# Patient Record
Sex: Female | Born: 1970 | Hispanic: Yes | Marital: Married | Smoking: Former smoker
Health system: Southern US, Community
[De-identification: ages and names within clinical notes are randomized; demographics above are authoritative.]

## PROBLEM LIST (undated history)

## (undated) DIAGNOSIS — K259 Gastric ulcer, unspecified as acute or chronic, without hemorrhage or perforation: Secondary | ICD-10-CM

## (undated) DIAGNOSIS — R58 Hemorrhage, not elsewhere classified: Secondary | ICD-10-CM

## (undated) DIAGNOSIS — K219 Gastro-esophageal reflux disease without esophagitis: Secondary | ICD-10-CM

## (undated) DIAGNOSIS — Z5189 Encounter for other specified aftercare: Secondary | ICD-10-CM

## (undated) HISTORY — DX: Hemorrhage, not elsewhere classified: R58

## (undated) HISTORY — DX: Gastro-esophageal reflux disease without esophagitis: K21.9

## (undated) HISTORY — DX: Gastric ulcer, unspecified as acute or chronic, without hemorrhage or perforation: K25.9

## (undated) HISTORY — DX: Encounter for other specified aftercare: Z51.89

---

## 2016-07-27 LAB — GLUCOSE, POCT (MANUAL RESULT ENTRY): POC Glucose: 91 mg/dl (ref 70–99)

## 2019-01-18 ENCOUNTER — Other Ambulatory Visit: Payer: Self-pay

## 2019-01-18 DIAGNOSIS — Z20822 Contact with and (suspected) exposure to covid-19: Secondary | ICD-10-CM

## 2019-01-19 LAB — NOVEL CORONAVIRUS, NAA: SARS-CoV-2, NAA: NOT DETECTED

## 2019-04-17 ENCOUNTER — Other Ambulatory Visit: Payer: Self-pay

## 2019-04-17 DIAGNOSIS — Z20822 Contact with and (suspected) exposure to covid-19: Secondary | ICD-10-CM

## 2019-04-19 LAB — NOVEL CORONAVIRUS, NAA: SARS-CoV-2, NAA: DETECTED — AB

## 2019-05-28 ENCOUNTER — Ambulatory Visit: Payer: 59 | Attending: Internal Medicine

## 2019-05-28 DIAGNOSIS — Z20822 Contact with and (suspected) exposure to covid-19: Secondary | ICD-10-CM | POA: Insufficient documentation

## 2019-05-29 LAB — NOVEL CORONAVIRUS, NAA: SARS-CoV-2, NAA: NOT DETECTED

## 2019-06-02 ENCOUNTER — Other Ambulatory Visit: Payer: Self-pay

## 2020-01-03 ENCOUNTER — Ambulatory Visit (INDEPENDENT_AMBULATORY_CARE_PROVIDER_SITE_OTHER): Payer: 59 | Admitting: Family Medicine

## 2020-01-03 ENCOUNTER — Encounter: Payer: Self-pay | Admitting: Family Medicine

## 2020-01-03 ENCOUNTER — Ambulatory Visit (INDEPENDENT_AMBULATORY_CARE_PROVIDER_SITE_OTHER): Payer: 59

## 2020-01-03 ENCOUNTER — Other Ambulatory Visit: Payer: Self-pay

## 2020-01-03 VITALS — BP 117/71 | HR 73 | Temp 98.5°F | Ht 65.0 in | Wt 143.8 lb

## 2020-01-03 DIAGNOSIS — M542 Cervicalgia: Secondary | ICD-10-CM

## 2020-01-03 MED ORDER — GABAPENTIN 600 MG PO TABS: 300.0000 mg | ORAL_TABLET | Freq: Every day | ORAL | 5 refills | Status: DC

## 2020-01-03 NOTE — Progress Notes (Signed)
   8/30/20213:54 PM  Angela Lang [birth year], 49 y.o., female [id number]  Chief Complaint  Patient presents with  . Back Pain    having pain in neck and back for at least 1 yr. Taking ibuprofen for the pain. Thinks the pain contributes to her job, does lots of pushing heavy boxes    HPI:   Patient is a 49 y.o. 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  No flowsheet data found.   No Known Allergies  Prior to Admission medications   Medication Sig Start Date End Date Taking? Authorizing Provider  IBUPROFEN PO Take by mouth.   Yes [provider]    No past medical history on file.  History reviewed. No pertinent surgical history.  Social History   Tobacco Use  . Smoking status: Not on file  Substance Use Topics  . Alcohol use: Not on file    No family history on file.  ROS Per hpi  OBJECTIVE:  Today's Vitals   01/03/20 1533  BP: 117/71  Pulse: 73  Temp: 98.5 F (36.9 C)  SpO2: 98%  Weight: 143 lb 12.8 oz (65.2 kg)  Height: 5\' 5"  (1.651 m)   Body mass index is 23.93 kg/m.   Physical Exam Vitals and nursing note reviewed.  Constitutional:      Appearance: She is well-developed.  HENT:     Head: Normocephalic and atraumatic.  Eyes:     General: No scleral icterus.    Conjunctiva/sclera: Conjunctivae normal.     Pupils: Pupils are equal, round, and reactive to light.  Neck:     Comments: Neg spurling test Pulmonary:     Effort: Pulmonary effort is normal.  Musculoskeletal:     Cervical back: Normal range of motion and neck supple. Pain with movement (extension, left lateral extension),  spinous process tenderness (~ C5-C6) and muscular tenderness present.     Comments: Neg phalen and tinsel, bilateral wrists Positive left elbow flexion test  Skin:    General: Skin is warm and dry.  Neurological:     Mental Status: She is alert and oriented to person, place, and time.     No results found for this or any previous visit (from the past 24 hour(s)).  No results found.   ASSESSMENT and PLAN  1. Neck pain Discussed supportive measures, new meds r/se/b and RTC precautions.  - DG Cervical Spine Complete - pending - Ambulatory referral to Physical Therapy - gabapentin (NEURONTIN) 600 MG tablet; Take 0.5-1 tablets (300-600 mg total) by mouth at bedtime.  No follow-ups on file.    , MD Primary Care at [facility] [address] [place], [place] Ph.  [phone] Fax [phone]

## 2020-01-03 NOTE — Patient Instructions (Signed)
° ° ° °  If you have lab work done today you will be contacted with your lab results within the next 2 weeks.  If you have not heard from us then please contact us. The fastest way to get your results is to register for My Chart. ° ° °IF you received an x-ray today, you will receive an invoice from [facility]. Please contact [facility] at [phone] with questions or concerns regarding your invoice.  ° °IF you received labwork today, you will receive an invoice from [facility]. Please contact [facility] at [phone] with questions or concerns regarding your invoice.  ° °Our billing staff will not be able to assist you with questions regarding bills from these companies. ° °You will be contacted with the lab results as soon as they are available. The fastest way to get your results is to activate your My Chart account. Instructions are located on the last page of this paperwork. If you have not heard from us regarding the results in 2 weeks, please contact this office. °  ° ° ° °

## 2020-01-28 ENCOUNTER — Telehealth: Payer: Self-pay | Admitting: General Practice

## 2020-01-28 NOTE — Telephone Encounter (Signed)
Pt came in and is wanting her referral to physical therapy changed to a location that is closer to pt.   She would like referral to be at location listed below:  [facility] at [facility] [address] [place], [place], [place] [address]  Please advise.

## 2020-03-28 ENCOUNTER — Encounter: Payer: Self-pay | Admitting: Emergency Medicine

## 2020-03-28 ENCOUNTER — Other Ambulatory Visit: Payer: Self-pay

## 2020-03-28 ENCOUNTER — Ambulatory Visit (INDEPENDENT_AMBULATORY_CARE_PROVIDER_SITE_OTHER): Payer: 59 | Admitting: Emergency Medicine

## 2020-03-28 VITALS — BP 101/68 | HR 69 | Temp 98.6°F | Resp 16 | Ht 63.5 in | Wt 144.0 lb

## 2020-03-28 DIAGNOSIS — Z23 Encounter for immunization: Secondary | ICD-10-CM | POA: Diagnosis not present

## 2020-03-28 DIAGNOSIS — G8929 Other chronic pain: Secondary | ICD-10-CM | POA: Diagnosis not present

## 2020-03-28 DIAGNOSIS — M542 Cervicalgia: Secondary | ICD-10-CM

## 2020-03-28 DIAGNOSIS — Z7689 Persons encountering health services in other specified circumstances: Secondary | ICD-10-CM

## 2020-03-28 NOTE — Patient Instructions (Signed)
Mantenimiento de la salud en las mujeres Health Maintenance, Female Adoptar un estilo de vida saludable y recibir atencin preventiva son importantes para promover la salud y el bienestar. Consulte al mdico sobre:  El esquema adecuado para hacerse pruebas y exmenes peridicos.  Cosas que puede hacer por su cuenta para prevenir enfermedades y mantenerse sana. Qu debo saber sobre la dieta, el peso y el ejercicio? Consuma una dieta saludable   Consuma una dieta que incluya muchas verduras, frutas, productos lcteos con bajo contenido de grasa y protenas magras.  No consuma muchos alimentos ricos en grasas slidas, azcares agregados o sodio. Mantenga un peso saludable El ndice de masa muscular (IMC) se utiliza para identificar problemas de peso. Proporciona una estimacin de la grasa corporal basndose en el peso y la altura. Su mdico puede ayudarle a determinar su IMC y a lograr o mantener un peso saludable. Haga ejercicio con regularidad Haga ejercicio con regularidad. Esta es una de las prcticas ms importantes que puede hacer por su salud. La mayora de los adultos deben seguir estas pautas:  Realizar, al menos, 150minutos de actividad fsica por semana. El ejercicio debe aumentar la frecuencia cardaca y hacerlo transpirar (ejercicio de intensidad moderada).  Hacer ejercicios de fortalecimiento por lo menos dos veces por semana. Agregue esto a su plan de ejercicio de intensidad moderada.  Pasar menos tiempo sentados. Incluso la actividad fsica ligera puede ser beneficiosa. Controle sus niveles de colesterol y lpidos en la sangre Comience a realizarse anlisis de lpidos y colesterol en la sangre a los 20aos y luego reptalos cada 5aos. Hgase controlar los niveles de colesterol con mayor frecuencia si:  Sus niveles de lpidos y colesterol son altos.  Es mayor de 40aos.  Presenta un alto riesgo de padecer enfermedades cardacas. Qu debo saber sobre las pruebas de  deteccin del cncer? Segn su historia clnica y sus antecedentes familiares, es posible que deba realizarse pruebas de deteccin del cncer en diferentes edades. Esto puede incluir pruebas de deteccin de lo siguiente:  Cncer de mama.  Cncer de cuello uterino.  Cncer colorrectal.  Cncer de piel.  Cncer de pulmn. Qu debo saber sobre la enfermedad cardaca, la diabetes y la hipertensin arterial? Presin arterial y enfermedad cardaca  La hipertensin arterial causa enfermedades cardacas y aumenta el riesgo de accidente cerebrovascular. Es ms probable que esto se manifieste en las personas que tienen lecturas de presin arterial alta, tienen ascendencia africana o tienen sobrepeso.  Hgase controlar la presin arterial: ? Cada 3 a 5 aos si tiene entre 18 y 39 aos. ? Todos los aos si es mayor de 40aos. Diabetes Realcese exmenes de deteccin de la diabetes con regularidad. Este anlisis revisa el nivel de azcar en la sangre en ayunas. Hgase las pruebas de deteccin:  Cada tresaos despus de los 40aos de edad si tiene un peso normal y un bajo riesgo de padecer diabetes.  Con ms frecuencia y a partir de una edad inferior si tiene sobrepeso o un alto riesgo de padecer diabetes. Qu debo saber sobre la prevencin de infecciones? Hepatitis B Si tiene un riesgo ms alto de contraer hepatitis B, debe someterse a un examen de deteccin de este virus. Hable con el mdico para averiguar si tiene riesgo de contraer la infeccin por hepatitis B. Hepatitis C Se recomienda el anlisis a:  Todos los que nacieron entre 1945 y 1965.  Todas las personas que tengan un riesgo de haber contrado hepatitis C. Enfermedades de transmisin sexual (ETS)  Hgase las   pruebas de deteccin de ITS, incluidas la gonorrea y la clamidia, si: ? Es sexualmente activa y es menor de 24aos. ? Es mayor de 24aos, y el mdico le informa que corre riesgo de tener este tipo de  infecciones. ? La actividad sexual ha cambiado desde que le hicieron la ltima prueba de deteccin y tiene un riesgo mayor de tener clamidia o gonorrea. Pregntele al mdico si usted tiene riesgo.  Pregntele al mdico si usted tiene un alto riesgo de contraer VIH. El mdico tambin puede recomendarle un medicamento recetado para ayudar a evitar la infeccin por el VIH. Si elige tomar medicamentos para prevenir el VIH, primero debe hacerse los anlisis de deteccin del VIH. Luego debe hacerse anlisis cada 3meses mientras est tomando los medicamentos. Embarazo  Si est por dejar de menstruar (fase premenopusica) y usted puede quedar embarazada, busque asesoramiento antes de quedar embarazada.  Tome de 400 a 800microgramos (mcg) de cido flico todos los das si queda embarazada.  Pida mtodos de control de la natalidad (anticonceptivos) si desea evitar un embarazo no deseado. Osteoporosis y menopausia La osteoporosis es una enfermedad en la que los huesos pierden los minerales y la fuerza por el avance de la edad. El resultado pueden ser fracturas en los huesos. Si tiene 65aos o ms, o si est en riesgo de sufrir osteoporosis y fracturas, pregunte a su mdico si debe:  Hacerse pruebas de deteccin de prdida sea.  Tomar un suplemento de calcio o de vitamina D para reducir el riesgo de fracturas.  Recibir terapia de reemplazo hormonal (TRH) para tratar los sntomas de la menopausia. Siga estas instrucciones en su casa: Estilo de vida  No consuma ningn producto que contenga nicotina o tabaco, como cigarrillos, cigarrillos electrnicos y tabaco de mascar. Si necesita ayuda para dejar de fumar, consulte al mdico.  No consuma drogas.  No comparta agujas.  Solicite ayuda a su mdico si necesita apoyo o informacin para abandonar las drogas. Consumo de alcohol  No beba alcohol si: ? Su mdico le indica no hacerlo. ? Est embarazada, puede estar embarazada o est tratando de quedar  embarazada.  Si bebe alcohol: ? Limite la cantidad que consume de 0 a 1 medida por da. ? Limite la ingesta si est amamantando.  Est atento a la cantidad de alcohol que hay en las bebidas que toma. En los [place], una medida equivale a una botella de cerveza de 12oz (355ml), un vaso de vino de 5oz (148ml) o un vaso de una bebida alcohlica de alta graduacin de 1oz (44ml). Instrucciones generales  Realcese los estudios de rutina de la salud, dentales y de la vista.  Mantngase al da con las vacunas.  Infrmele a su mdico si: ? Se siente deprimida con frecuencia. ? Alguna vez ha sido vctima de maltrato o no se siente segura en su casa. Resumen  Adoptar un estilo de vida saludable y recibir atencin preventiva son importantes para promover la salud y el bienestar.  Siga las instrucciones del mdico acerca de una dieta saludable, el ejercicio y la realizacin de pruebas o exmenes para detectar enfermedades.  Siga las instrucciones del mdico con respecto al control del colesterol y la presin arterial. Esta informacin no tiene como fin reemplazar el consejo del mdico. Asegrese de hacerle al mdico cualquier pregunta que tenga. Document Revised: 05/13/2018 Document Reviewed: 05/13/2018 Elsevier Patient Education  2020 Elsevier Inc.  

## 2020-03-28 NOTE — Progress Notes (Signed)
Vida Rigger 49 y.o.   Chief Complaint  Patient presents with  . Transitions Of Care  . Neck Pain    per patient her neck contious to hurt     HISTORY OF PRESENT ILLNESS: This is a 49 y.o. female here to establish care with me.  Used to see Dr. Leretha Pol. Seen last August for neck pain.  Cervical spine x-rays are unremarkable with mild degenerative changes seen. Started on gabapentin taking at nighttime with good results. No new symptoms or medical concerns today. Was referred to physical therapy but unable to fit into her schedule.  HPI   Prior to Admission medications   Medication Sig Start Date End Date Taking? Authorizing Provider  gabapentin (NEURONTIN) 600 MG tablet Take 0.5-1 tablets (300-600 mg total) by mouth at bedtime. 01/03/20   Myles Lipps, MD  IBUPROFEN PO Take by mouth.    [provider]    No Known Allergies  There are no problems to display for this patient.   No past medical history on file.  No past surgical history on file.  Social History   Socioeconomic History  . Marital status: Married    Spouse name: Not on file  . Number of children: Not on file  . Years of education: Not on file  . Highest education level: Not on file  Occupational History  . Not on file  Tobacco Use  . Smoking status: Not on file  Substance and Sexual Activity  . Alcohol use: Not on file  . Drug use: Not on file  . Sexual activity: Not on file  Other Topics Concern  . Not on file  Social History Narrative  . Not on file   Social Determinants of Health   Financial Resource Strain:   . Difficulty of Paying Living Expenses: Not on file  Food Insecurity:   . Worried About Programme researcher, broadcasting/film/video in the Last Year: Not on file  . Ran Out of Food in the Last Year: Not on file  Transportation Needs:   . Lack of Transportation (Medical): Not on file  . Lack of Transportation (Non-Medical): Not on file  Physical Activity:   . Days of Exercise per  Week: Not on file  . Minutes of Exercise per Session: Not on file  Stress:   . Feeling of Stress : Not on file  Social Connections:   . Frequency of Communication with Friends and Family: Not on file  . Frequency of Social Gatherings with Friends and Family: Not on file  . Attends Religious Services: Not on file  . Active Member of Clubs or Organizations: Not on file  . Attends Banker Meetings: Not on file  . Marital Status: Not on file  Intimate Partner Violence:   . Fear of Current or Ex-Partner: Not on file  . Emotionally Abused: Not on file  . Physically Abused: Not on file  . Sexually Abused: Not on file    No family history on file.   Review of Systems  Constitutional: Negative.  Negative for chills and fever.  HENT: Negative.  Negative for congestion and sore throat.   Respiratory: Negative.  Negative for cough and shortness of breath.   Cardiovascular: Negative.  Negative for chest pain and palpitations.  Gastrointestinal: Negative.  Negative for abdominal pain, constipation, diarrhea, nausea and vomiting.  Genitourinary: Negative.   Musculoskeletal: Positive for neck pain.  Skin: Negative.  Negative for rash.  Neurological: Negative.  Negative for dizziness  and headaches.  All other systems reviewed and are negative.    Physical Exam Vitals reviewed.  Constitutional:      Appearance: Normal appearance.  HENT:     Head: Normocephalic.  Eyes:     Extraocular Movements: Extraocular movements intact.     Conjunctiva/sclera: Conjunctivae normal.     Pupils: Pupils are equal, round, and reactive to light.  Cardiovascular:     Rate and Rhythm: Normal rate and regular rhythm.     Pulses: Normal pulses.     Heart sounds: Normal heart sounds.  Pulmonary:     Effort: Pulmonary effort is normal.     Breath sounds: Normal breath sounds.  Musculoskeletal:        General: Normal range of motion.     Cervical back: Normal range of motion and neck supple.  No tenderness.  Lymphadenopathy:     Cervical: No cervical adenopathy.  Skin:    General: Skin is warm and dry.     Capillary Refill: Capillary refill takes less than 2 seconds.  Neurological:     General: No focal deficit present.     Mental Status: She is alert and oriented to person, place, and time.  Psychiatric:        Mood and Affect: Mood normal.        Behavior: Behavior normal.      ASSESSMENT & PLAN: In[place] was seen today for transitions of care and neck pain.  Diagnoses and all orders for this visit:  Encounter to establish care -     Ambulatory referral to Gynecology  Need for influenza vaccination -     Flu Vaccine QUAD 36+ mos IM  Chronic neck pain  Musculoskeletal neck pain    Patient Instructions  Mantenimiento de la salud en las mujeres Health Maintenance, Female Adoptar un estilo de vida saludable y recibir atencin preventiva son importantes para promover la salud y Counsellor. Consulte al mdico sobre:  El esquema adecuado para hacerse pruebas y exmenes peridicos.  Cosas que puede hacer por su cuenta para prevenir enfermedades y [employer]. Qu debo saber sobre la dieta, el peso y el ejercicio? Consuma una dieta saludable   Consuma una dieta que incluya muchas verduras, frutas, productos lcteos con bajo contenido de [place] y Associate Professor.  No consuma muchos alimentos ricos en grasas slidas, azcares agregados o sodio. Mantenga un peso saludable El ndice de masa muscular [facility]) se [place] para identificar problemas de [place]. Proporciona una estimacin de la grasa corporal basndose en el peso y la altura. Su mdico puede ayudarle a Engineer, site IMC y a Personnel officer o Pharmacologist un peso saludable. Haga ejercicio con regularidad Haga ejercicio con regularidad. Esta es una de las prcticas ms importantes que puede hacer por su salud. La mayora de los adultos deben seguir estas pautas:  Education officer, environmental, al menos, de actividad fsica por  semana. El ejercicio debe aumentar la frecuencia cardaca y Media planner transpirar (ejercicio de intensidad moderada).  Hacer ejercicios de fortalecimiento por lo [employer] por semana. Agregue esto a su plan de ejercicio de intensidad moderada.  Pasar menos tiempo sentados. Incluso la actividad fsica ligera puede ser beneficiosa. Controle sus niveles de colesterol y lpidos en la sangre Comience a realizarse anlisis de lpidos y Oncologist en la sangre a los 20aos y luego reptalos cada 5aos. Hgase controlar los niveles de colesterol con mayor frecuencia si:  Sus niveles de lpidos y colesterol son altos.  Es mayor de 40aos.  Presenta  un alto riesgo de Psychiatric nursepadecer enfermedades cardacas. Qu debo saber sobre las pruebas de deteccin del cncer? Segn su historia clnica y sus antecedentes familiares, es posible que deba realizarse pruebas de deteccin del cncer en diferentes edades. Esto puede incluir pruebas de deteccin de lo siguiente:  Cncer de mama.  Cncer de cuello uterino.  Cncer colorrectal.  Cncer de piel.  Cncer de pulmn. Qu debo saber sobre la enfermedad cardaca, la diabetes y la hipertensin arterial? Presin arterial y enfermedad cardaca  La hipertensin arterial causa enfermedades cardacas y Lesothoaumenta el riesgo de accidente cerebrovascular. Es ms probable que esto se manifieste en las personas que tienen lecturas de presin arterial alta, tienen ascendencia africana o tienen sobrepeso.  Hgase controlar la presin arterial: ? Cada 3 a 5 aos si tiene entre 18 y 639 aos. ? Todos los aos si es mayor de [place]40aos. Diabetes Realcese exmenes de deteccin de la diabetes con regularidad. Este anlisis revisa el nivel de azcar en la sangre en [place]. Hgase las pruebas de deteccin:  Cada tresaos despus de los 40aos de edad si tiene un peso normal y un bajo riesgo de padecer diabetes.  Con ms frecuencia y a partir de [place] ed[place] inferior si tiene  sobrepeso o un alto riesgo de padecer diabetes. Qu debo saber sobre la prevencin de infecciones? Hepatitis B Si tiene un riesgo ms alto de contraer hepatitis B, debe someterse a un examen de deteccin de este virus. Hable con el mdico para averiguar si tiene riesgo de contraer la infeccin por hepatitis B. Hepatitis C Se recomienda el anlisis a:  Celanese Corporationodos los que nacieron entre 1945 y 1965.  Todas las personas que tengan un riesgo de haber contrado hepatitis C. Enfermedades de transmisin sexual (ETS)  Hgase las pruebas de Airline pilotdeteccin de ITS, incluidas la gonorrea y la clamidia, si: ? Es sexualmente activa y es menor de [place]24aos. ? Es mayor de 24aos, y Public affairs consultantel mdico le informa que corre riesgo de tener este tipo de infecciones. ? La actividad sexual ha cambiado desde que le hicieron la ltima prueba de deteccin y tiene un riesgo mayor de Warehouse managertener clamidia o Copygonorrea. Pregntele al mdico si usted tiene riesgo.  Pregntele al mdico si usted tiene un alto riesgo de Primary school teachercontraer VIH. El mdico tambin puede recomendarle un medicamento recetado para ayudar a evitar la infeccin por el VIH. Si elige tomar medicamentos para prevenir el VIH, primero debe ONEOKhacerse los anlisis de deteccin del VIH. Luego debe hacerse anlisis cada 3meses mientras est tomando los medicamentos. Embarazo  Si est por dejar de Armed forces training and education officermenstruar (fase premenopusica) y usted puede quedar [place]embarazada, busque asesoramiento antes de [place]iquedar embarazada.  Tome de 400 a 800microgramos (mcg) de cido Ecolabflico todos los das si [place]queda embarazada.  Pida mtodos de control de la natalidad (anticonceptivos) si desea evitar un embarazo no deseado. Osteoporosis y [place]opausia La osteoporosis es una enfermedad en la que los huesos pierden los minerales y la fuerza por el avance de la edad. El resultado pueden ser fracturas en los [place] Land[place]huesos. Si tiene 65aos o ms, o si est en riesgo de sufrir osteoporosis y fracturas, pregunte a su mdico si  debe:  Hacerse pruebas de deteccin de prdida sea.  Tomar un suplemento de calcio o de vitamina D para reducir el riesgo de fracturas.  Recibir terapia de reemplazo hormonal (TRH) para tratar los sntomas de la menopausia. Siga estas instrucciones en su casa: Estilo de vida  No consuma ningn producto que contenga nicotina o tabaco, como cigarrillos,  cigarrillos electrnicos y tabaco de Theatre manager. Si necesita ayuda para dejar de fumar, consulte al mdico.  No consuma drogas.  No comparta agujas.  Solicite ayuda a su mdico si necesita apoyo o informacin para abandonar las drogas. Consumo de alcohol  No beba alcohol si: ? Su mdico le indica no hacerlo. ? Est embarazada, puede estar embarazada o est tratando de quedar embarazada.  Si bebe alcohol: ? Limite la cantidad que consume de 0 a 1 medida por da. ? Limite la ingesta si est amamantando.  Est atento a la cantidad de alcohol que hay en las bebidas que toma. En los [place], una medida equivale a una botella de cerveza de 12oz ( ), un vaso de vino de 5oz ( ) o un vaso de una bebida alcohlica de alta graduacin de 1oz (75ml). Instrucciones generales  Realcese los estudios de rutina de la salud, dentales y de Wellsite geologist.  Mantngase al da con las vacunas.  Infrmele a su mdico si: ? Se siente deprimida con frecuencia. ? Alguna vez ha sido vctima de [place] o no se siente segura en su casa. Resumen  Adoptar un estilo de vida saludable y recibir atencin preventiva son importantes para promover la salud y Counsellor.  Siga las instrucciones del mdico acerca de una dieta saludable, el ejercicio y la realizacin de pruebas o exmenes para Hotel manager.  Siga las instrucciones del mdico con respecto al control del colesterol y la presin arterial. Esta informacin no tiene Theme park manager el consejo del mdico. Asegrese de hacerle al mdico cualquier pregunta que tenga. Document  Revised: 05/13/2018 Document Reviewed: 05/13/2018 Elsevier Patient Education  2020 Elsevier Inc.      Edwina Barth, MD [facility]

## 2020-05-23 ENCOUNTER — Other Ambulatory Visit: Payer: Self-pay

## 2020-07-16 ENCOUNTER — Encounter: Payer: Self-pay | Admitting: *Deleted

## 2020-07-16 ENCOUNTER — Other Ambulatory Visit: Payer: Self-pay

## 2020-07-16 ENCOUNTER — Ambulatory Visit
Admission: EM | Admit: 2020-07-16 | Discharge: 2020-07-16 | Disposition: A | Discharge status: Home / Self Care | Payer: 59

## 2020-07-16 DIAGNOSIS — J069 Acute upper respiratory infection, unspecified: Secondary | ICD-10-CM | POA: Diagnosis not present

## 2020-07-16 DIAGNOSIS — J019 Acute sinusitis, unspecified: Secondary | ICD-10-CM

## 2020-07-16 DIAGNOSIS — H1033 Unspecified acute conjunctivitis, bilateral: Secondary | ICD-10-CM

## 2020-07-16 MED ORDER — AMOXICILLIN-POT CLAVULANATE 875-125 MG PO TABS: 1.0000 | ORAL_TABLET | Freq: Two times a day (BID) | ORAL | 0 refills | Status: DC

## 2020-07-16 MED ORDER — FLUTICASONE PROPIONATE 50 MCG/ACT NA SUSP: 2.0000 | Freq: Every day | NASAL | 0 refills | Status: DC

## 2020-07-16 MED ORDER — POLYMYXIN B-TRIMETHOPRIM 10000-0.1 UNIT/ML-% OP SOLN: 1.0000 [drp] | OPHTHALMIC | 0 refills | Status: AC

## 2020-07-16 NOTE — ED Provider Notes (Signed)
[facility]    CSN: [id number] Arrival date & time: 07/16/20  1348      History   Chief Complaint Chief Complaint  Patient presents with  . Ear Pain  . Nasal Congestion  . Eye Problem    HPI Angela Lang is a 50 y.o. female.   Angela Lang presents with complaints of sinus congestion and pressure, as well as right ear pressure, which started a week ago. Also now with bilateral eye redness and mattering. She feels like she has had drainage from right ear. Started out to right eye and now is to both eyes. No known fevers, headache or body aches. No gi symptoms. Had covid around a year ago. 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 No shortness of breath. She has had sore throat.   Spanish video interpreter used to collect history and physical exam.    ROS per HPI, negative if not otherwise mentioned.      History reviewed. No pertinent past medical history.  There are no problems to display for this patient.   Past Surgical History:  Procedure Laterality Date  . CESAREAN SECTION      OB History   No obstetric history on file.      Home Medications    Prior to Admission medications   Medication Sig Start Date End Date Taking? Authorizing Provider  amoxicillin-clavulanate (AUGMENTIN) 875-125 MG tablet Take 1 tablet by mouth every 12 (twelve) hours. 07/16/20  Yes Mable Dara, Barron Alvine, NP  fluticasone (FLONASE) 50 MCG/ACT nasal spray Place 2 sprays into both nostrils daily. 07/16/20  Yes Linus Mako B, NP  IBUPROFEN PO Take by mouth.   Yes [provider]  Pseudoephedrine HCl (SUDAFED PO) Take by mouth.   Yes [provider]  trimethoprim-polymyxin b (POLYTRIM) ophthalmic solution Place 1 drop into both eyes every 4 (four) hours for 5 days. 07/16/20 07/21/20 Yes Faydra Korman, Barron Alvine, NP  gabapentin (NEURONTIN) 600 MG tablet Take 0.5-1 tablets (300-600 mg total) by mouth at bedtime. 01/03/20   Noni Saupe, MD    Family History Family History  Problem Relation Age of Onset  . Healthy Mother     Social History Social History   Tobacco Use  . Smoking status: Former Smoker    Years: 10.00    Types: Cigarettes  . Smokeless tobacco: Never Used  Vaping Use  . Vaping Use: Never used  Substance Use Topics  . Alcohol use: Never  . Drug use: Never     Allergies   Patient has no known allergies.   Review of Systems Review of Systems   Physical Exam Triage Vital Signs ED Triage Vitals  Enc Vitals Group     BP 07/16/20 1410 116/79     Pulse Rate 07/16/20 1410 66     Resp 07/16/20 1410 14     Temp 07/16/20 1410 98 F (36.7 C)     Temp Source 07/16/20 1410 Temporal     SpO2 07/16/20 1410 97 %     Weight --      Height --      Head Circumference --      Peak Flow --      Pain Score 07/16/20 1411 7     Pain Loc --      Pain Edu? --      Excl. in GC? --    No data found.  Updated Vital Signs BP 116/79  Pulse 66   Temp 98 F (36.7 C) (Temporal)   Resp 14   SpO2 97%   Visual Acuity Right Eye Distance:   Left Eye Distance:   Bilateral Distance:    Right Eye Near:   Left Eye Near:    Bilateral Near:     Physical Exam Constitutional:      General: She is not in acute distress.    Appearance: She is well-developed. She is ill-appearing.  HENT:     Right Ear: Tympanic membrane and ear canal normal. There is no impacted cerumen.     Left Ear: Tympanic membrane and ear canal normal.     Nose: Rhinorrhea present.     Right Sinus: Maxillary sinus tenderness and frontal sinus tenderness present.     Left Sinus: Maxillary sinus tenderness and frontal sinus tenderness present.     Mouth/Throat:     Mouth: Mucous membranes are moist.  Eyes:     General: Lids are normal. Vision grossly intact.     Conjunctiva/sclera:     Right eye: Right conjunctiva is injected.     Left eye: Left conjunctiva is injected.     Comments: R>L injection   Cardiovascular:      Rate and Rhythm: Normal rate and regular rhythm.  Pulmonary:     Effort: Pulmonary effort is normal.  Skin:    General: Skin is warm and dry.  Neurological:     Mental Status: She is alert and oriented to person, place, and time.      UC Treatments / Results  Labs (all labs ordered are listed, but only abnormal results are displayed) Labs Reviewed  NOVEL CORONAVIRUS, NAA    EKG   Radiology No results found.  Procedures Procedures (including critical care time)  Medications Ordered in UC Medications - No data to display  Initial Impression / Assessment and Plan / UC Course  I have reviewed the triage vital signs and the nursing notes.  Pertinent labs & imaging results that were available during my care of the patient were reviewed by me and considered in my medical decision making (see chart for details).     Worsening of sinus symptoms and ear pressure for a week now, as well as conjunctivitis, on exam. Augmentin and polytrim provided. pcr covid testing for return to work. Return precautions provided. Patient and S/O verbalized understanding and agreeable to plan.   Final Clinical Impressions(s) / UC Diagnoses   Final diagnoses:  Acute upper respiratory infection  Acute sinusitis, recurrence not specified, unspecified location  Acute conjunctivitis of both eyes, unspecified acute conjunctivitis type     Discharge Instructions     Push fluids to ensure adequate hydration and keep secretions thin.  Tylenol and/or ibuprofen as needed for pain or fevers.  Complete course of antibiotics for you sinus infection, as well as the eye drops for your eyes. Avoid rubbing your eyes as able.  Daily flonase nasal spray to also help with your ears and sinus symptoms.  If symptoms worsen or do not improve in the next week to return to be seen or to follow up with your primary care provider     ED Prescriptions    Medication Sig Dispense Auth. Provider    trimethoprim-polymyxin b (POLYTRIM) ophthalmic solution Place 1 drop into both eyes every 4 (four) hours for 5 days. 10 mL Linus Mako B, NP   amoxicillin-clavulanate (AUGMENTIN) 875-125 MG tablet Take 1 tablet by mouth every 12 (twelve) hours. 14 tablet  Georgetta Haber, NP   fluticasone (FLONASE) 50 MCG/ACT nasal spray Place 2 sprays into both nostrils daily. 11.1 mL Linus Mako B, NP     PDMP not reviewed this encounter.   Georgetta Haber, NP 07/16/20 1511

## 2020-07-16 NOTE — ED Triage Notes (Addendum)
C/O bilat earache with dizziness and nasal congestion onset 1 wks ago; now having sinus pressure, sore throat, body aches, cough, bilat red & irritated eyes.  Denies fevers. Reports negative home Covid tests 3/8 & 3/9.

## 2020-07-16 NOTE — Discharge Instructions (Signed)
Push fluids to ensure adequate hydration and keep secretions thin.  Tylenol and/or ibuprofen as needed for pain or fevers.  Complete course of antibiotics for you sinus infection, as well as the eye drops for your eyes. Avoid rubbing your eyes as able.  Daily flonase nasal spray to also help with your ears and sinus symptoms.  If symptoms worsen or do not improve in the next week to return to be seen or to follow up with your primary care provider

## 2020-07-17 LAB — SARS-COV-2, NAA 2 DAY TAT

## 2020-07-17 LAB — NOVEL CORONAVIRUS, NAA: SARS-CoV-2, NAA: NOT DETECTED

## 2020-07-24 ENCOUNTER — Ambulatory Visit: Payer: 59 | Admitting: Emergency Medicine

## 2020-07-26 ENCOUNTER — Ambulatory Visit (INDEPENDENT_AMBULATORY_CARE_PROVIDER_SITE_OTHER): Payer: 59 | Admitting: Emergency Medicine

## 2020-07-26 ENCOUNTER — Encounter: Payer: Self-pay | Admitting: Emergency Medicine

## 2020-07-26 ENCOUNTER — Other Ambulatory Visit: Payer: Self-pay

## 2020-07-26 VITALS — BP 99/65 | HR 70 | Temp 98.0°F | Resp 16 | Ht 62.99 in | Wt 144.0 lb

## 2020-07-26 DIAGNOSIS — H60391 Other infective otitis externa, right ear: Secondary | ICD-10-CM | POA: Diagnosis not present

## 2020-07-26 DIAGNOSIS — H9201 Otalgia, right ear: Secondary | ICD-10-CM | POA: Diagnosis not present

## 2020-07-26 MED ORDER — CIPROFLOXACIN-DEXAMETHASONE 0.3-0.1 % OT SUSP: 4.0000 [drp] | Freq: Two times a day (BID) | OTIC | 1 refills | Status: DC

## 2020-07-26 NOTE — Progress Notes (Signed)
Angela Lang 50 y.o.   Chief Complaint  Patient presents with  . Ear Pain    HISTORY OF PRESENT ILLNESS: This is a 50 y.o. female complaining of pain to right ear for several days. Just finished a course of Augmentin prescribed for respiratory infection at urgent care center 10 days ago. No other complaints or medical concerns today.  HPI   Prior to Admission medications   Medication Sig Start Date End Date Taking? Authorizing Provider  amoxicillin-clavulanate (AUGMENTIN) 875-125 MG tablet Take 1 tablet by mouth every 12 (twelve) hours. 07/16/20   Georgetta Haber, NP  fluticasone (FLONASE) 50 MCG/ACT nasal spray Place 2 sprays into both nostrils daily. 07/16/20   Georgetta Haber, NP  gabapentin (NEURONTIN) 600 MG tablet Take 0.5-1 tablets (300-600 mg total) by mouth at bedtime. 01/03/20   Noni Saupe, MD  IBUPROFEN PO Take by mouth.    [provider]  Pseudoephedrine HCl (SUDAFED PO) Take by mouth.    [provider]    No Known Allergies  There are no problems to display for this patient.   No past medical history on file.  Past Surgical History:  Procedure Laterality Date  . CESAREAN SECTION      Social History   Socioeconomic History  . Marital status: Married    Spouse name: Not on file  . Number of children: Not on file  . Years of education: Not on file  . Highest education level: Not on file  Occupational History  . Not on file  Tobacco Use  . Smoking status: Former Smoker    Years: 10.00    Types: Cigarettes  . Smokeless tobacco: Never Used  Vaping Use  . Vaping Use: Never used  Substance and Sexual Activity  . Alcohol use: Never  . Drug use: Never  . Sexual activity: Not on file  Other Topics Concern  . Not on file  Social History Narrative  . Not on file   Social Determinants of Health   Financial Resource Strain: Not on file  Food Insecurity: Not on file  Transportation Needs: Not on file  Physical  Activity: Not on file  Stress: Not on file  Social Connections: Not on file  Intimate Partner Violence: Not on file    Family History  Problem Relation Age of Onset  . Healthy Mother      Review of Systems  Constitutional: Negative.  Negative for chills and fever.  HENT: Positive for ear pain. Negative for congestion and sore throat.   Respiratory: Negative.  Negative for cough and shortness of breath.   Cardiovascular: Negative.  Negative for chest pain and palpitations.  Gastrointestinal: Negative for abdominal pain, diarrhea, nausea and vomiting.  Genitourinary: Negative.  Negative for dysuria and hematuria.  Skin: Negative.  Negative for rash.  Neurological: Negative.  Negative for dizziness and headaches.  All other systems reviewed and are negative.    Today's Vitals   07/26/20 1337  BP: 99/65  Pulse: 70  Resp: 16  Temp: 98 F (36.7 C)  TempSrc: Temporal  SpO2: 97%  Weight: 144 lb (65.3 kg)  Height: 5' 2.99" (1.6 m)   Body mass index is 25.51 kg/m. Wt Readings from Last 3 Encounters:  07/26/20 144 lb (65.3 kg)  03/28/20 144 lb (65.3 kg)  01/03/20 143 lb 12.8 oz (65.2 kg)     Physical Exam Vitals reviewed.  Constitutional:      Appearance: Normal appearance.  HENT:  Head: Normocephalic.     Right Ear: Tympanic membrane normal.     Left Ear: Tympanic membrane, ear canal and external ear normal.     Ears:     Comments: Hyperemic and slightly tender external canal    Mouth/Throat:     Mouth: Mucous membranes are moist.     Pharynx: Oropharynx is clear. No oropharyngeal exudate.  Eyes:     Extraocular Movements: Extraocular movements intact.     Conjunctiva/sclera: Conjunctivae normal.     Pupils: Pupils are equal, round, and reactive to light.  Cardiovascular:     Rate and Rhythm: Normal rate.  Pulmonary:     Effort: Pulmonary effort is normal.  Musculoskeletal:        General: Normal range of motion.     Cervical back: Normal range of motion.  No tenderness.  Lymphadenopathy:     Cervical: No cervical adenopathy.  Skin:    General: Skin is warm and dry.  Neurological:     General: No focal deficit present.     Mental Status: She is alert and oriented to person, place, and time.  Psychiatric:        Mood and Affect: Mood normal.        Behavior: Behavior normal.      ASSESSMENT & PLAN: Jodeen was seen today for ear pain.  Diagnoses and all orders for this visit:  Infective otitis externa of right ear -     ciprofloxacin-dexamethasone (CIPRODEX) OTIC suspension; Place 4 drops into the right ear 2 (two) times daily.  Acute otalgia, right    Patient Instructions       If you have lab work done today you will be contacted with your lab results within the next 2 weeks.  If you have not heard from [place] then please contact us. The fastest way to get your results is to register for My Chart.   IF you received an x-ray today, you will receive an invoice from [facility]. Please contact [facility] at [phone] with questions or concerns regarding your invoice.   IF you received labwork today, you will receive an invoice from [facility]. Please contact [facility] at [phone] with questions or concerns regarding your invoice.   Our billing staff will not be able to assist you with questions regarding bills from these companies.  You will be contacted with the lab results as soon as they are available. The fastest way to get your results is to activate your My Chart account. Instructions are located on the last page of this paperwork. If you have not heard from [place] regarding the results in 2 weeks, please contact this office.     Dolor de [employer] adultos [place], Adult Un dolor de odo puede deberse a muchas causas, que incluyen lo siguiente:  Una infeccin.  Acumulacin de cerumen.  Presin en el odo.  Algo en el odo que no debera estar ah (cuerpo extrao).  Dolor de  Advertising copywriter.  Problemas dentales.  Problemas en la mandbula. El tratamiento del dolor de odo depender de la causa. Si la causa no est clara o no se Counselling psychologist, deber observar los sntomas hasta que el dolor de odo desaparezca o hasta que se descubra la causa. Siga estas instrucciones en su casa: Medicamentos  Tome o aplquese los medicamentos de venta libre y los recetados solamente como se lo haya indicado el mdico.  Si le recetaron un antibitico, selo como se lo haya indicado el mdico. No  deje de usar el antibitico aunque comience a sentirse mejor.  No se ponga nada en el odo que no sean los medicamentos que Glass blower/designer. Control del dolor Si se lo indican, aplique calor en la zona afectada con la frecuencia que le haya indicado el mdico. Use la fuente de calor que el mdico le recomiende, como una compresa de calor hmedo o una almohadilla trmica.  Coloque una toalla entre la piel y la fuente de Airline pilot.  Aplique calor durante 20 a .  Retire la fuente de calor si la piel se pone de color rojo brillante. Esto es especialmente importante si no puede sentir dolor, calor o fro. Puede correr un riesgo mayor de sufrir quemaduras. Si se lo indican, aplique hielo en la zona afectada con la frecuencia que le haya indicado el mdico. Para hacer esto:  Ponga el hielo en una bolsa plstica.  Coloque una toalla entre la piel y Copy.  Coloque el hielo durante , 2 a 3veces por Futures trader.      Instrucciones generales  Est atenta a cualquier cambio en los sntomas.  Intente descansar en posicin erguida, en lugar de recostarse. Esto puede ayudar a reducir la presin en el odo y Engineer, materials.  Mastique goma de mascar si esto ayuda a Engineer, materials de odos.  Trate cualquier [employer] se lo haya indicado el mdico.  Beba suficiente lquido como para Pharmacologist la orina de color amarillo plido.  Es su responsabilidad [employer]  de cualquier prueba que se haya realizado. Consulte al mdico o pregunte en el departamento donde se realizan las pruebas cundo estarn [employer].  Concurra a todas las visitas de [address] se lo haya indicado el mdico. Esto es importante. Comunquese con un mdico si:  El dolor no mejora en el trmino de 2das.  El dolor de odo [place].  Aparecen nuevos sntomas.  Tiene fiebre. Busque ayuda de inmediato si:  Tiene un dolor de cabeza intenso.  Tiene rigidez en el cuello.  Tiene dificultad para tragar.  Tiene enrojecimiento o hinchazn detrs de Museum/gallery conservator.  Le sale sangre o lquido del odo.  Pierde la audicin.  Se siente mareado. Resumen  El dolor de odo puede deberse a muchas causas.  El tratamiento del dolor de odo depender de la causa. Siga las recomendaciones del mdico para tratar su dolor de odo.  Si la causa no est clara o no se Counselling psychologist, deber observar los sntomas hasta que el dolor de odo desaparezca o hasta que se descubra la causa.  Concurra a todas las visitas de [address] se lo haya indicado el mdico. Esto es importante. Esta informacin no tiene Theme park manager el consejo del mdico. Asegrese de hacerle al mdico cualquier pregunta que tenga. Document Revised: 01/07/2019 Document Reviewed: 01/07/2019 Elsevier Patient Education  2021 Elsevier Inc.      Edwina Barth, MD [facility]

## 2020-07-26 NOTE — Patient Instructions (Addendum)
If you have lab work done today you will be contacted with your lab results within the next 2 weeks.  If you have not heard from [place] then please contact us. The fastest way to get your results is to register for My Chart.   IF you received an x-ray today, you will receive an invoice from [facility]. Please contact [facility] at [phone] with questions or concerns regarding your invoice.   IF you received labwork today, you will receive an invoice from [facility]. Please contact [facility] at [phone] with questions or concerns regarding your invoice.   Our billing staff will not be able to assist you with questions regarding bills from these companies.  You will be contacted with the lab results as soon as they are available. The fastest way to get your results is to activate your My Chart account. Instructions are located on the last page of this paperwork. If you have not heard from [place] regarding the results in 2 weeks, please contact this office.     Dolor de [employer] adultos [place], Adult Un dolor de odo puede deberse a muchas causas, que incluyen lo siguiente:  Una infeccin.  Acumulacin de cerumen.  Presin en el odo.  Algo en el odo que no debera estar ah (cuerpo extrao).  Dolor de Advertising copywriter.  Problemas dentales.  Problemas en la mandbula. El tratamiento del dolor de odo depender de la causa. Si la causa no est clara o no se Counselling psychologist, deber observar los sntomas hasta que el dolor de odo desaparezca o hasta que se descubra la causa. Siga estas instrucciones en su casa: Medicamentos  Tome o aplquese los medicamentos de venta libre y los recetados solamente como se lo haya indicado el mdico.  Si le recetaron un antibitico, selo como se lo haya indicado el mdico. No deje de usar el antibitico aunque comience a Actor.  No se ponga nada en el odo que no sean los medicamentos que Glass blower/designer. Control  del dolor Si se lo indican, aplique calor en la zona afectada con la frecuencia que le haya indicado el mdico. Use la fuente de calor que el mdico le recomiende, como una compresa de calor hmedo o una almohadilla trmica.  Coloque una toalla entre la piel y la fuente de Airline pilot.  Aplique calor durante 20 a .  Retire la fuente de calor si la piel se pone de color rojo brillante. Esto es especialmente importante si no puede sentir dolor, calor o fro. Puede correr un riesgo mayor de sufrir quemaduras. Si se lo indican, aplique hielo en la zona afectada con la frecuencia que le haya indicado el mdico. Para hacer esto:  Ponga el hielo en una bolsa plstica.  Coloque una toalla entre la piel y Copy.  Coloque el hielo durante , 2 a 3veces por Futures trader.      Instrucciones generales  Est atenta a cualquier cambio en los sntomas.  Intente descansar en posicin erguida, en lugar de recostarse. Esto puede ayudar a reducir la presin en el odo y Engineer, materials.  Mastique goma de mascar si esto ayuda a Engineer, materials de odos.  Trate cualquier [employer] se lo haya indicado el mdico.  Beba suficiente lquido como para Pharmacologist la orina de color amarillo plido.  Es su responsabilidad [employer] de cualquier prueba que se haya realizado. Consulte al mdico o pregunte en el departamento donde se [employer]  pruebas cundo estarn [employer].  Concurra a todas las visitas de [address] se lo haya indicado el mdico. Esto es importante. Comunquese con un mdico si:  El dolor no mejora en el trmino de 2das.  El dolor de odo [place].  Aparecen nuevos sntomas.  Tiene fiebre. Busque ayuda de inmediato si:  Tiene un dolor de cabeza intenso.  Tiene rigidez en el cuello.  Tiene dificultad para tragar.  Tiene enrojecimiento o hinchazn detrs de Museum/gallery conservator.  Le sale sangre o lquido del odo.  Pierde la audicin.  Se  siente mareado. Resumen  El dolor de odo puede deberse a muchas causas.  El tratamiento del dolor de odo depender de la causa. Siga las recomendaciones del mdico para tratar su dolor de odo.  Si la causa no est clara o no se Counselling psychologist, deber observar los sntomas hasta que el dolor de odo desaparezca o hasta que se descubra la causa.  Concurra a todas las visitas de [address] se lo haya indicado el mdico. Esto es importante. Esta informacin no tiene Theme park manager el consejo del mdico. Asegrese de hacerle al mdico cualquier pregunta que tenga. Document Revised: 01/07/2019 Document Reviewed: 01/07/2019 Elsevier Patient Education  2021 [employer].

## 2020-07-27 ENCOUNTER — Encounter: Payer: Self-pay | Admitting: Emergency Medicine

## 2020-07-27 ENCOUNTER — Other Ambulatory Visit: Payer: Self-pay | Admitting: Emergency Medicine

## 2020-07-27 MED ORDER — HYDROCORTISONE-ACETIC ACID 1-2 % OT SOLN: 3.0000 [drp] | Freq: Three times a day (TID) | OTIC | 1 refills | Status: DC

## 2020-07-27 NOTE — Telephone Encounter (Signed)
Pt requesting more affordable ear drops

## 2020-12-19 ENCOUNTER — Encounter: Payer: Self-pay | Admitting: Emergency Medicine

## 2020-12-20 ENCOUNTER — Other Ambulatory Visit: Payer: Self-pay | Admitting: Emergency Medicine

## 2020-12-20 DIAGNOSIS — Z7689 Persons encountering health services in other specified circumstances: Secondary | ICD-10-CM

## 2020-12-26 NOTE — Telephone Encounter (Signed)
Please look into the following: GYN referral that we placed.  No contact yet.  Patient asking for gynecologist's office phone number she can call and make appointment herself.

## 2020-12-27 NOTE — Telephone Encounter (Signed)
Reviewed pt chart for appointment for OBGYN. Pt has OV 01/29/21.

## 2021-01-29 ENCOUNTER — Encounter: Payer: 59 | Admitting: Family Medicine

## 2021-03-13 ENCOUNTER — Encounter: Payer: Self-pay | Admitting: Advanced Practice Midwife

## 2021-03-13 ENCOUNTER — Other Ambulatory Visit (HOSPITAL_COMMUNITY)
Admission: RE | Admit: 2021-03-13 | Discharge: 2021-03-13 | Disposition: A | Discharge status: Home / Self Care | Payer: 59 | Source: Ambulatory Visit | Attending: Family Medicine | Admitting: Family Medicine

## 2021-03-13 ENCOUNTER — Telehealth: Payer: Self-pay | Admitting: General Practice

## 2021-03-13 ENCOUNTER — Other Ambulatory Visit: Payer: Self-pay

## 2021-03-13 ENCOUNTER — Ambulatory Visit (INDEPENDENT_AMBULATORY_CARE_PROVIDER_SITE_OTHER): Payer: 59 | Admitting: Advanced Practice Midwife

## 2021-03-13 VITALS — BP 109/79 | HR 72 | Ht 63.39 in | Wt 148.0 lb

## 2021-03-13 DIAGNOSIS — N898 Other specified noninflammatory disorders of vagina: Secondary | ICD-10-CM | POA: Insufficient documentation

## 2021-03-13 DIAGNOSIS — Z0000 Encounter for general adult medical examination without abnormal findings: Secondary | ICD-10-CM | POA: Insufficient documentation

## 2021-03-13 DIAGNOSIS — N951 Menopausal and female climacteric states: Secondary | ICD-10-CM

## 2021-03-13 DIAGNOSIS — Z01419 Encounter for gynecological examination (general) (routine) without abnormal findings: Secondary | ICD-10-CM

## 2021-03-13 NOTE — Telephone Encounter (Signed)
Patient aware of insurance ([employer]) will not be in network with [place] effective 05/06/2021.  Information given to patient of when open enrollment will be and choosing a new plan.  Pt verbalized understanding. 

## 2021-03-13 NOTE — Progress Notes (Signed)
GYNECOLOGY ANNUAL PREVENTATIVE CARE ENCOUNTER NOTE  Subjective:   Angela Lang is a 50 y.o. [phone] female here for a routine annual gynecologic exam.  Current complaints: irregular cycles for 3 years, intermittent vaginal discharge..   Denies pelvic pain, problems with intercourse or other gynecologic concerns.    Gynecologic History Patient's last menstrual period was 01/13/2021. Contraception: tubal ligation Last Pap: unsure. Results were: normal Last mammogram: none  Obstetric History OB History  Gravida Para Term Preterm AB Living  2 2 2     2   SAB IAB Ectopic Multiple Live Births          2    # Outcome Date GA Lbr Len/2nd Weight Sex Delivery Anes PTL Lv  2 Term 2006 [redacted]w[redacted]d   M CS-LTranv EPI N LIV  1 Term 2000 [redacted]w[redacted]d   M CS-LTranv EPI N LIV    History reviewed. No pertinent past medical history.  Past Surgical History:  Procedure Laterality Date   CESAREAN SECTION      Current Outpatient Medications on File Prior to Visit  Medication Sig Dispense Refill   acetic acid-hydrocortisone (VOSOL-HC) OTIC solution Place 3 drops into the left ear 3 (three) times daily. (Patient not taking: Reported on 03/13/2021) 10 mL 1   fluticasone (FLONASE) 50 MCG/ACT nasal spray Place 2 sprays into both nostrils daily. (Patient not taking: Reported on 03/13/2021) 11.1 mL 0   gabapentin (NEURONTIN) 600 MG tablet Take 0.5-1 tablets (300-600 mg total) by mouth at bedtime. (Patient not taking: Reported on 03/13/2021) 30 tablet 5   IBUPROFEN PO Take by mouth. (Patient not taking: Reported on 03/13/2021)     No current facility-administered medications on file prior to visit.    No Known Allergies  Social History   Socioeconomic History   Marital status: Married    Spouse name: Not on file   Number of children: Not on file   Years of education: Not on file   Highest education level: Not on file  Occupational History   Not on file  Tobacco Use   Smoking status: Former    Years:  10.00    Types: Cigarettes   Smokeless tobacco: Never  Vaping Use   Vaping Use: Never used  Substance and Sexual Activity   Alcohol use: Never   Drug use: Never   Sexual activity: Yes  Other Topics Concern   Not on file  Social History Narrative   Not on file   Social Determinants of Health   Financial Resource Strain: Not on file  Food Insecurity: Not on file  Transportation Needs: Not on file  Physical Activity: Not on file  Stress: Not on file  Social Connections: Not on file  Intimate Partner Violence: Not on file    Family History  Problem Relation Age of Onset   Healthy Mother     The following portions of the patient's history were reviewed and updated as appropriate: allergies, current medications, past family history, past medical history, past social history, past surgical history and problem list.  Review of Systems Pertinent items noted in HPI and remainder of comprehensive ROS otherwise negative.   Objective:  BP 109/79   Pulse 72   Ht 5' 3.39" (1.61 m)   Wt 148 lb (67.1 kg)   LMP 01/13/2021   BMI 25.90 kg/m  CONSTITUTIONAL: Well-developed, well-nourished female in no acute distress.  HENT:  Normocephalic, atraumatic, External right and left ear normal. Oropharynx is clear and moist EYES: Conjunctivae and EOM are  normal. Pupils are equal, round, and reactive to light. No scleral icterus.  NECK: Normal range of motion, supple, no masses.  Normal thyroid.  SKIN: Skin is warm and dry. No rash noted. Not diaphoretic. No erythema. No pallor. NEUROLOGIC: Alert and oriented to person, place, and time. Normal reflexes, muscle tone coordination. No cranial nerve deficit noted. PSYCHIATRIC: Normal mood and affect. Normal behavior. Normal judgment and thought content. CARDIOVASCULAR: Normal heart rate noted, regular rhythm RESPIRATORY: Clear to auscultation bilaterally. Effort and breath sounds normal, no problems with respiration noted. BREASTS: Symmetric in  size. No masses, skin changes, nipple drainage, or lymphadenopathy. ABDOMEN: Soft, normal bowel sounds, no distention noted.  No tenderness, rebound or guarding.  PELVIC: Normal appearing external genitalia; normal appearing vaginal mucosa and cervix.  No abnormal discharge noted.  Pap smear obtained.  Normal uterine size, no other palpable masses, no uterine or adnexal tenderness. MUSCULOSKELETAL: Normal range of motion. No tenderness.  No cyanosis, clubbing, or edema.  2+ distal pulses.   Assessment:  Annual gynecologic examination with pap smear Cultures sent per request Perimenopausal menstrual cycle irregularities   Plan:  Will follow up results of pap smear and manage accordingly. Mammogram scheduled Referred to gastroenterology to discuss possible colonoscopy Cultures sent per request for infections Routine preventative health maintenance measures emphasized. Please refer to After Visit Summary for other counseling recommendations.

## 2021-03-13 NOTE — Progress Notes (Signed)
CAP interpreter Marta. 

## 2021-03-14 LAB — CERVICOVAGINAL ANCILLARY ONLY
Bacterial Vaginitis (gardnerella): NEGATIVE
Candida Glabrata: NEGATIVE
Candida Vaginitis: NEGATIVE
Chlamydia: NEGATIVE
Comment: NEGATIVE
Comment: NEGATIVE
Comment: NEGATIVE
Comment: NEGATIVE
Comment: NORMAL
Neisseria Gonorrhea: NEGATIVE

## 2021-03-14 LAB — CYTOLOGY - PAP
Comment: NEGATIVE
Diagnosis: NEGATIVE
High risk HPV: NEGATIVE

## 2021-04-23 ENCOUNTER — Ambulatory Visit (HOSPITAL_BASED_OUTPATIENT_CLINIC_OR_DEPARTMENT_OTHER)
Admission: RE | Admit: 2021-04-23 | Discharge: 2021-04-23 | Disposition: A | Discharge status: Home / Self Care | Payer: 59 | Source: Ambulatory Visit | Attending: Advanced Practice Midwife | Admitting: Advanced Practice Midwife

## 2021-04-23 ENCOUNTER — Encounter (HOSPITAL_BASED_OUTPATIENT_CLINIC_OR_DEPARTMENT_OTHER): Payer: Self-pay

## 2021-04-23 ENCOUNTER — Other Ambulatory Visit: Payer: Self-pay

## 2021-04-23 DIAGNOSIS — Z1231 Encounter for screening mammogram for malignant neoplasm of breast: Secondary | ICD-10-CM | POA: Diagnosis not present

## 2021-04-23 DIAGNOSIS — Z0000 Encounter for general adult medical examination without abnormal findings: Secondary | ICD-10-CM

## 2021-04-25 ENCOUNTER — Other Ambulatory Visit: Payer: Self-pay | Admitting: Advanced Practice Midwife

## 2021-04-25 ENCOUNTER — Other Ambulatory Visit: Payer: Self-pay | Admitting: *Deleted

## 2021-04-25 DIAGNOSIS — R928 Other abnormal and inconclusive findings on diagnostic imaging of breast: Secondary | ICD-10-CM

## 2021-06-11 ENCOUNTER — Ambulatory Visit
Admission: RE | Admit: 2021-06-11 | Discharge: 2021-06-11 | Disposition: A | Discharge status: Home / Self Care | Payer: Managed Care, Other (non HMO) | Source: Ambulatory Visit | Attending: Advanced Practice Midwife | Admitting: Advanced Practice Midwife

## 2021-06-11 ENCOUNTER — Other Ambulatory Visit: Payer: Self-pay

## 2021-06-11 ENCOUNTER — Other Ambulatory Visit: Payer: Self-pay | Admitting: Advanced Practice Midwife

## 2021-06-11 DIAGNOSIS — R928 Other abnormal and inconclusive findings on diagnostic imaging of breast: Secondary | ICD-10-CM

## 2021-06-11 DIAGNOSIS — N6310 Unspecified lump in the right breast, unspecified quadrant: Secondary | ICD-10-CM

## 2021-06-12 ENCOUNTER — Encounter: Payer: Self-pay | Admitting: Advanced Practice Midwife

## 2021-06-12 DIAGNOSIS — Z9851 Tubal ligation status: Secondary | ICD-10-CM | POA: Insufficient documentation

## 2021-06-12 DIAGNOSIS — N63 Unspecified lump in unspecified breast: Secondary | ICD-10-CM | POA: Insufficient documentation

## 2021-06-25 ENCOUNTER — Ambulatory Visit
Admission: RE | Admit: 2021-06-25 | Discharge: 2021-06-25 | Disposition: A | Discharge status: Home / Self Care | Payer: Managed Care, Other (non HMO) | Source: Ambulatory Visit | Attending: Advanced Practice Midwife | Admitting: Advanced Practice Midwife

## 2021-06-25 DIAGNOSIS — N6310 Unspecified lump in the right breast, unspecified quadrant: Secondary | ICD-10-CM

## 2021-11-07 ENCOUNTER — Encounter: Payer: Self-pay | Admitting: Emergency Medicine

## 2022-01-16 ENCOUNTER — Encounter: Payer: Self-pay | Admitting: General Practice

## 2022-03-19 ENCOUNTER — Ambulatory Visit: Payer: Commercial Managed Care - HMO | Admitting: Advanced Practice Midwife

## 2022-12-10 ENCOUNTER — Ambulatory Visit: Payer: Commercial Managed Care - HMO | Admitting: Medical

## 2023-01-15 ENCOUNTER — Ambulatory Visit (INDEPENDENT_AMBULATORY_CARE_PROVIDER_SITE_OTHER): Payer: Self-pay | Admitting: Obstetrics & Gynecology

## 2023-01-15 ENCOUNTER — Encounter: Payer: Self-pay | Admitting: Obstetrics & Gynecology

## 2023-01-15 VITALS — BP 110/75 | HR 88 | Ht 63.0 in | Wt 145.0 lb

## 2023-01-15 DIAGNOSIS — Z01419 Encounter for gynecological examination (general) (routine) without abnormal findings: Secondary | ICD-10-CM

## 2023-01-15 DIAGNOSIS — Z1211 Encounter for screening for malignant neoplasm of colon: Secondary | ICD-10-CM

## 2023-01-15 DIAGNOSIS — Z1231 Encounter for screening mammogram for malignant neoplasm of breast: Secondary | ICD-10-CM

## 2023-01-15 NOTE — Progress Notes (Signed)
Subjective:     Angela Lang is a 52 y.o. female here for a routine exam.  Current complaints: none. LMP >1-2 years prev. Occ hot flushes that are not a problem. Currenlty sexually active with no issues. Denies leakage of urine. .     Gynecologic History No LMP recorded. Patient is perimenopausal. Contraception: post menopausal status Last Pap: 03/13/21. Results were: normal Last mammogram: 06/11/2021. Results were: abnormal bi-rad 4. S/p bx: fibroadenoma  Obstetric History OB History  Gravida Para Term Preterm AB Living  2 2 2     2   SAB IAB Ectopic Multiple Live Births          2    # Outcome Date GA Lbr Len/2nd Weight Sex Type Anes PTL Lv  2 Term 2006 [redacted]w[redacted]d   M CS-LTranv EPI N LIV  1 Term 2000 [redacted]w[redacted]d   M CS-LTranv EPI N LIV     The following portions of the patient's history were reviewed and updated as appropriate: allergies, current medications, past family history, past medical history, past social history, past surgical history, and problem list.  Review of Systems Pertinent items are noted in HPI.    Objective:  BP 110/75   Pulse 88   Ht 5\' 3"  (1.6 m)   Wt 145 lb 0.6 oz (65.8 kg)   BMI 25.69 kg/m  General Appearance:    Alert, cooperative, no distress, appears stated age  Head:    Normocephalic, without obvious abnormality, atraumatic  Eyes:    conjunctiva/corneas clear, EOM's intact, both eyes  Ears:    Normal external ear canals, both ears  Nose:   Nares normal, septum midline, mucosa normal, no drainage    or sinus tenderness  Throat:   Lips, mucosa, and tongue normal; teeth and gums normal  Neck:   Supple, symmetrical, trachea midline, no adenopathy;    thyroid:  no enlargement/tenderness/nodules  Back:     Symmetric, no curvature, ROM normal, no CVA tenderness  Lungs:     respirations unlabored  Chest Wall:    No tenderness or deformity   Heart:    Regular rate and rhythm  Breast Exam:    No tenderness, masses, or nipple abnormality  Abdomen:      Soft, non-tender, bowel sounds active all four quadrants,    no masses, no organomegaly  Genitalia:    Normal female without lesion, discharge or tenderness     Extremities:   Extremities normal, atraumatic, no cyanosis or edema  Pulses:   2+ and symmetric all extremities  Skin:   Skin color, texture, turgor normal, no rashes or lesions     Assessment:    Healthy female exam.    Plan:   Angela Lang was seen today for annual exam.  Diagnoses and all orders for this visit:  Well female exam with routine gynecological exam  Breast cancer screening by mammogram -     MM 3D SCREENING MAMMOGRAM BILATERAL BREAST; Future  Colon cancer screening -     Ambulatory referral to Gastroenterology   F/u in 1 year or sooner prn  Spanish interpreter present for the entire visit.   Jarom Govan L. Harraway-Smith, M.D., [place] [place]

## 2023-01-15 NOTE — Addendum Note (Signed)
Addended by: Anell Barr on: 01/15/2023 04:24 PM   Modules accepted: Orders

## 2023-01-15 NOTE — Progress Notes (Signed)
[employer] language service interpreter Dorena Cookey [phone].  CAP interpreter Ana.

## 2023-06-13 IMAGING — MG MM BREAST LOCALIZATION CLIP
4 series · 4 of 12 positions shown · non-contrast
Comparison: Previous exam(s).

CLINICAL DATA: Status post right breast ultrasound-guided biopsy.

EXAM:
3D DIAGNOSTIC RIGHT MAMMOGRAM POST ULTRASOUND BIOPSY

[R ML synth-2D]
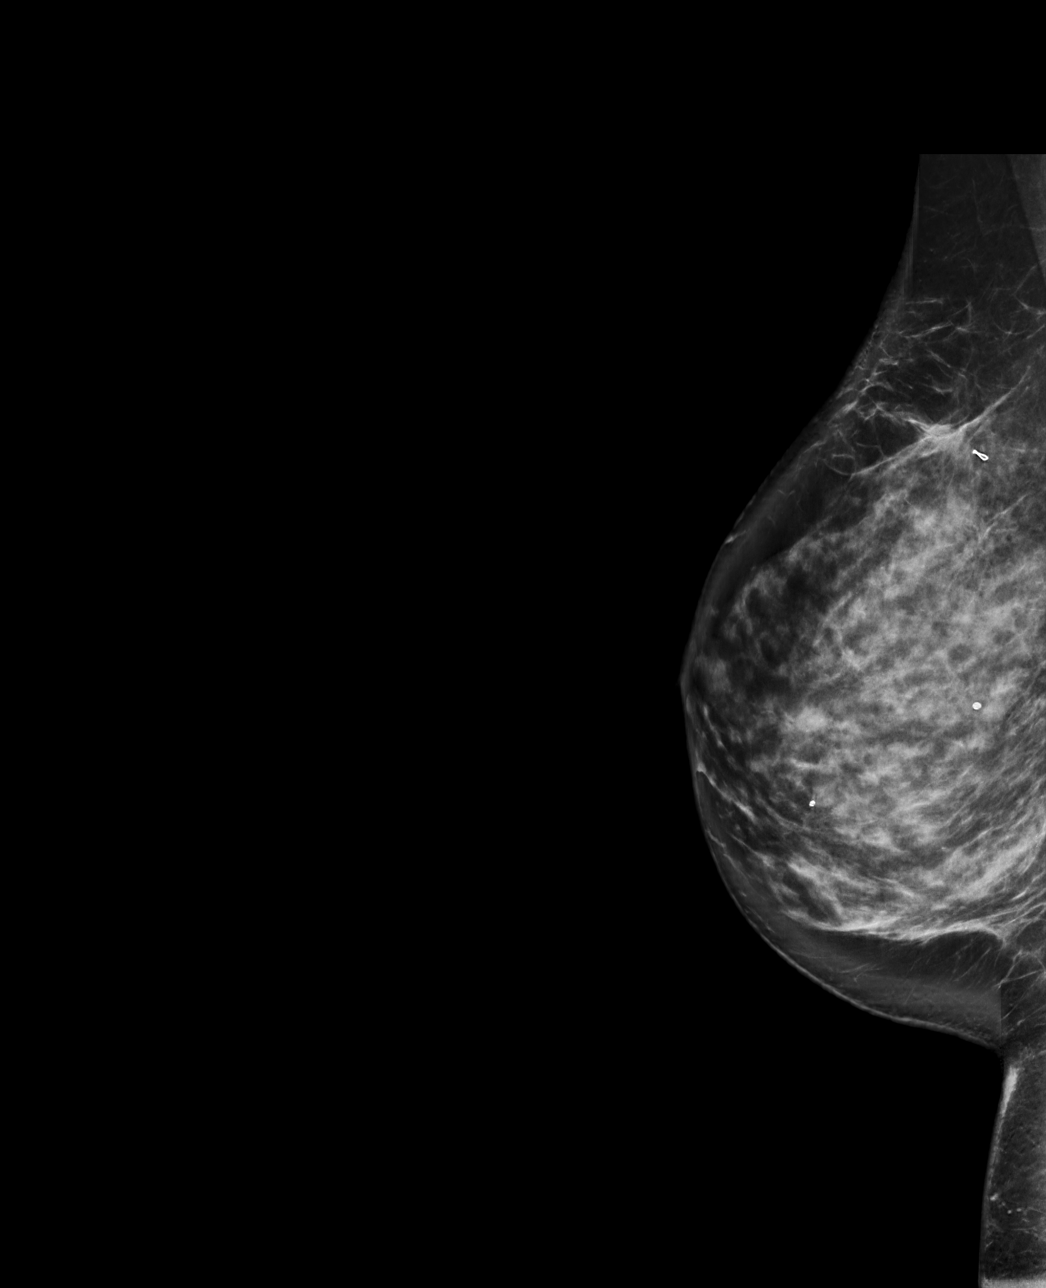

[R CC synth-2D]
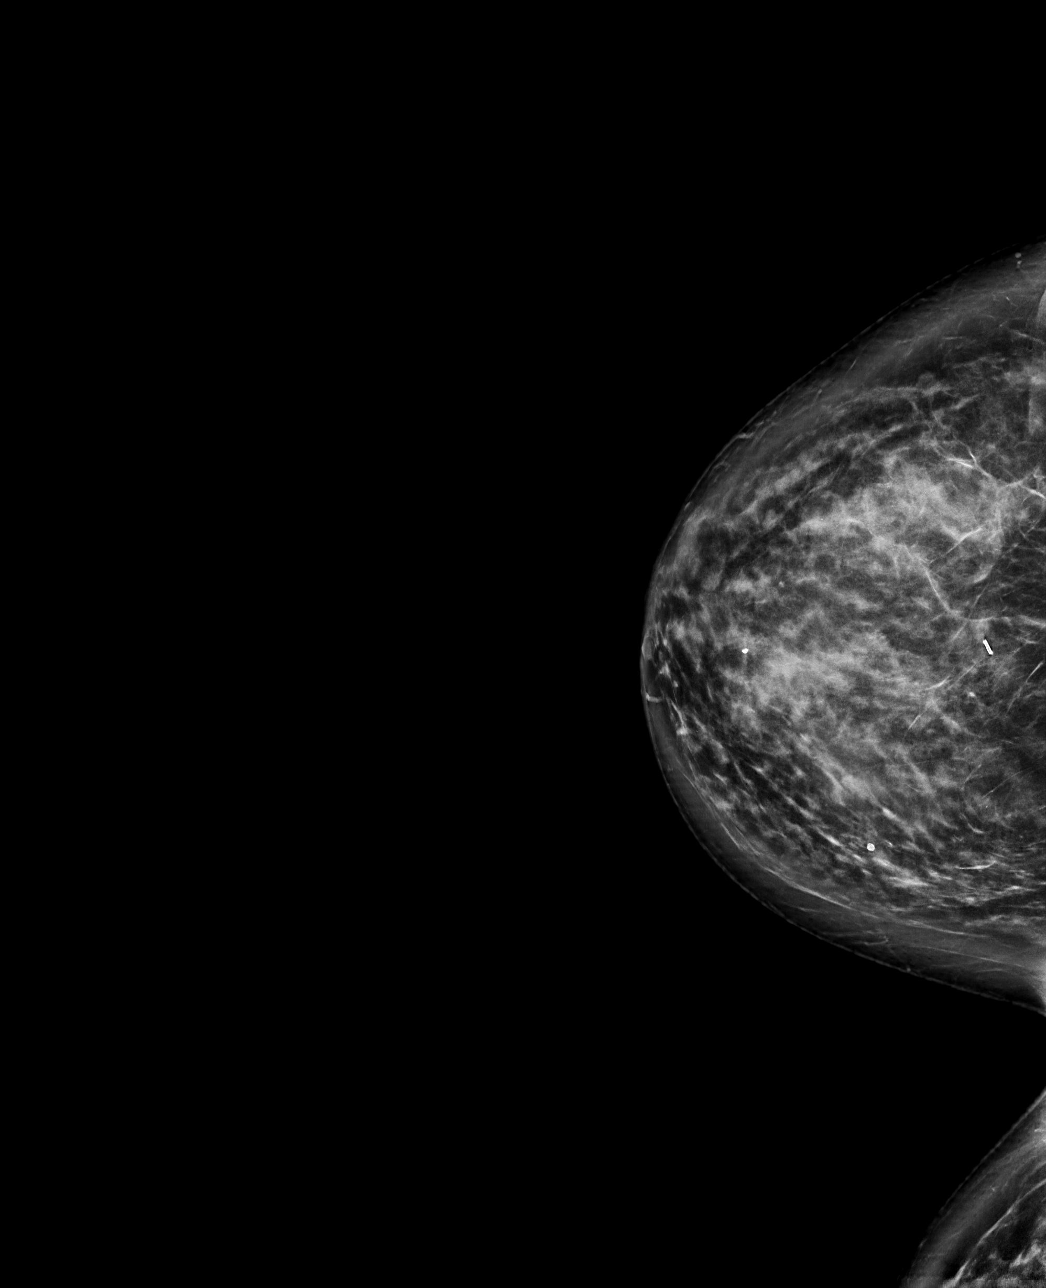

[R ML tomo · tomo slice 48/95.0]
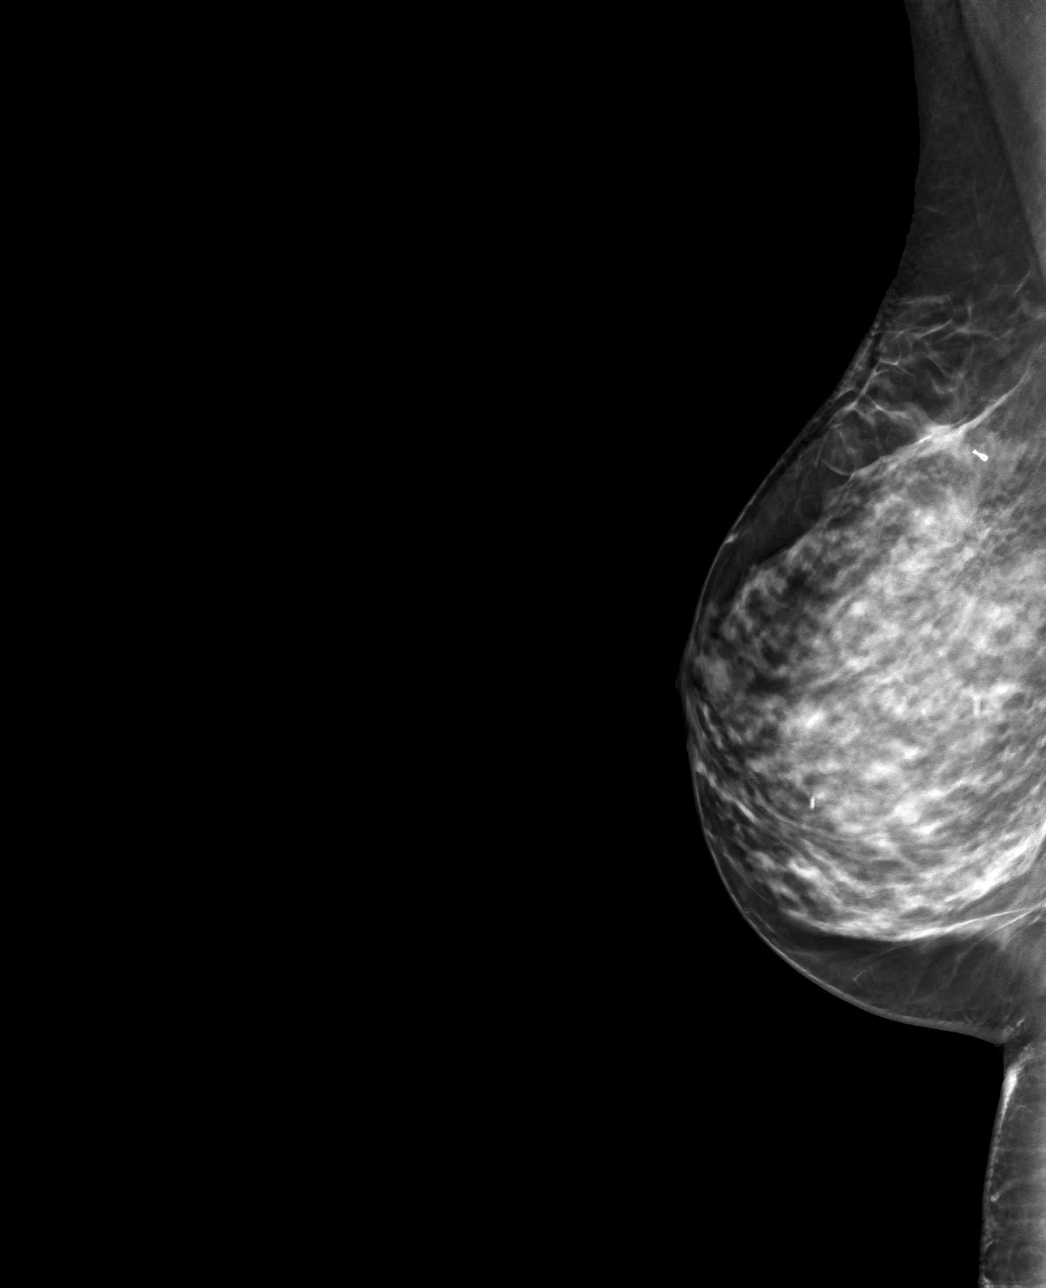

[R CC tomo · tomo slice 47/93.0]
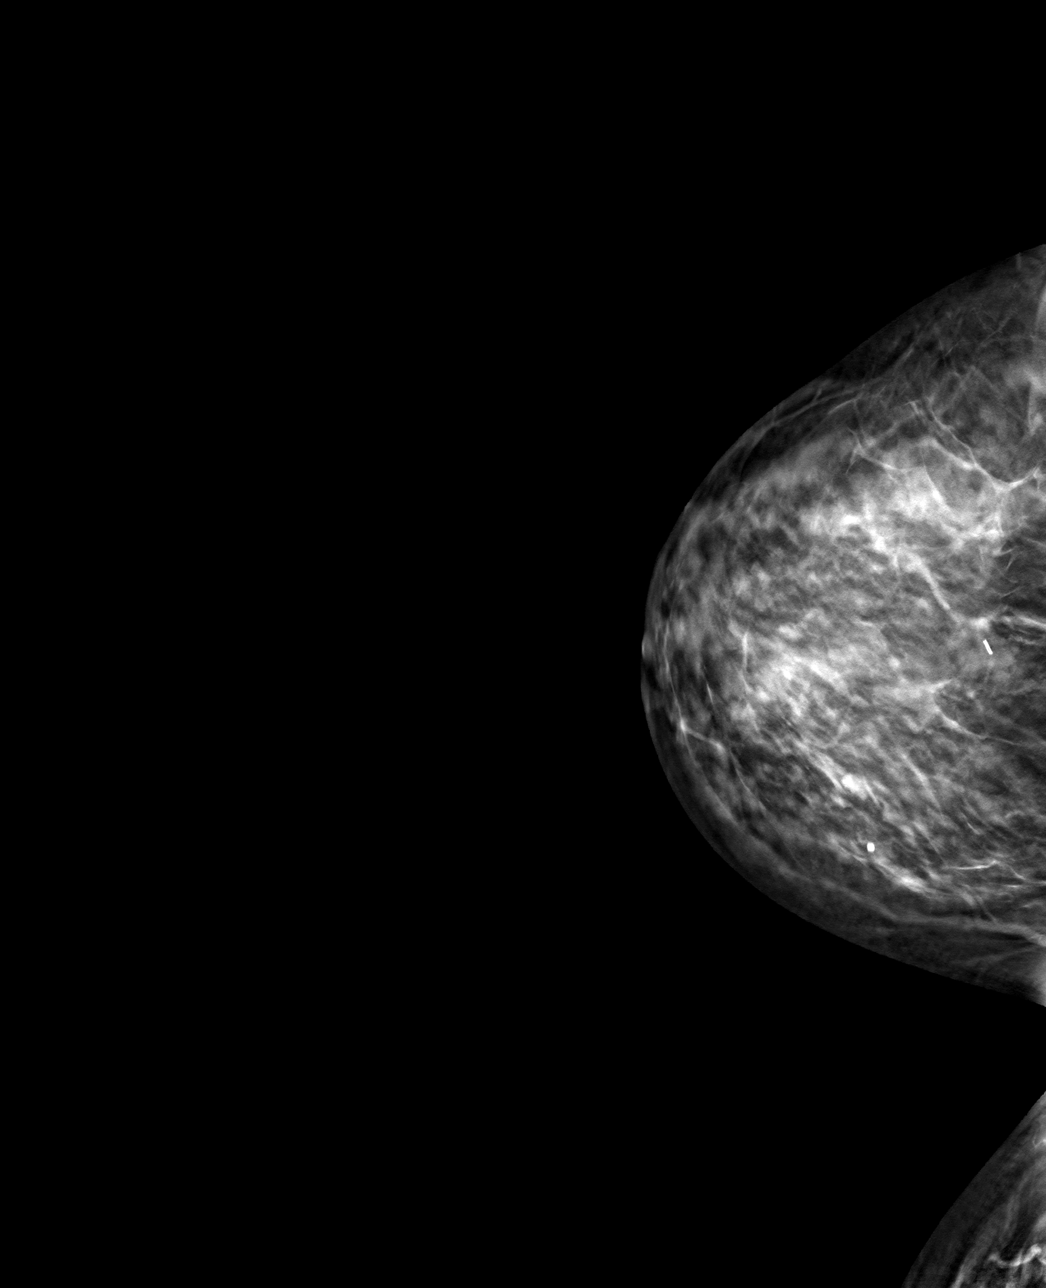

[4 of 12 positions shown; findings below may reference images not displayed]

FINDINGS: 3D Mammographic images were obtained following ultrasound guided
biopsy of the right breast. The biopsy marking clip is in expected
position at the site of biopsy.
IMPRESSION: Appropriate positioning of the ribbon shaped biopsy marking clip at
the site of biopsy in the upper right breast.

Final Assessment: Post Procedure Mammograms for Marker Placement

## 2023-08-05 ENCOUNTER — Telehealth: Payer: Self-pay | Admitting: Emergency Medicine

## 2023-08-05 NOTE — Telephone Encounter (Deleted)
 Copied from CRM [id number]. Topic: Appointments - Scheduling Inquiry for Clinic >> Aug 04, 2023  4:23 PM Denese Killings wrote: Reason for CRM: Patient husband is wanting to schedule an appointment with Dr. Alvy Bimler. Saul Fordyce is currently a patient of Dr. Alvy Bimler and wants her to be scheduled with him.

## 2023-09-08 NOTE — Telephone Encounter (Signed)
 error

## 2023-10-22 ENCOUNTER — Ambulatory Visit: Payer: Self-pay | Admitting: Medical

## 2023-10-29 ENCOUNTER — Encounter: Payer: Self-pay | Admitting: Emergency Medicine

## 2023-10-29 ENCOUNTER — Ambulatory Visit: Payer: Self-pay | Admitting: Emergency Medicine

## 2023-10-29 ENCOUNTER — Ambulatory Visit: Admitting: Emergency Medicine

## 2023-10-29 VITALS — BP 118/74 | HR 68 | Temp 98.6°F | Ht 63.0 in | Wt 135.0 lb

## 2023-10-29 DIAGNOSIS — Z13220 Encounter for screening for lipoid disorders: Secondary | ICD-10-CM

## 2023-10-29 DIAGNOSIS — Z13228 Encounter for screening for other metabolic disorders: Secondary | ICD-10-CM | POA: Diagnosis not present

## 2023-10-29 DIAGNOSIS — Z1211 Encounter for screening for malignant neoplasm of colon: Secondary | ICD-10-CM

## 2023-10-29 DIAGNOSIS — Z0001 Encounter for general adult medical examination with abnormal findings: Secondary | ICD-10-CM

## 2023-10-29 DIAGNOSIS — K6289 Other specified diseases of anus and rectum: Secondary | ICD-10-CM | POA: Diagnosis not present

## 2023-10-29 DIAGNOSIS — M792 Neuralgia and neuritis, unspecified: Secondary | ICD-10-CM | POA: Diagnosis not present

## 2023-10-29 DIAGNOSIS — Z1329 Encounter for screening for other suspected endocrine disorder: Secondary | ICD-10-CM | POA: Diagnosis not present

## 2023-10-29 DIAGNOSIS — Z1231 Encounter for screening mammogram for malignant neoplasm of breast: Secondary | ICD-10-CM

## 2023-10-29 DIAGNOSIS — Z130 Encounter for screening for diseases of the blood and blood-forming organs and certain disorders involving the immune mechanism: Secondary | ICD-10-CM | POA: Diagnosis not present

## 2023-10-29 DIAGNOSIS — Z0000 Encounter for general adult medical examination without abnormal findings: Secondary | ICD-10-CM | POA: Diagnosis not present

## 2023-10-29 LAB — CBC WITH DIFFERENTIAL/PLATELET
Basophils Absolute: 0 K/uL (ref 0.0–0.1)
Basophils Relative: 0.6 % (ref 0.0–3.0)
Eosinophils Absolute: 0.1 K/uL (ref 0.0–0.7)
Eosinophils Relative: 2 % (ref 0.0–5.0)
HCT: 40.6 % (ref 36.0–46.0)
Hemoglobin: 13.7 g/dL (ref 12.0–15.0)
Lymphocytes Relative: 26.4 % (ref 12.0–46.0)
Lymphs Abs: 1.6 K/uL (ref 0.7–4.0)
MCHC: 33.7 g/dL (ref 30.0–36.0)
MCV: 87.1 fl (ref 78.0–100.0)
Monocytes Absolute: 0.4 K/uL (ref 0.1–1.0)
Monocytes Relative: 6.5 % (ref 3.0–12.0)
Neutro Abs: 4 K/uL (ref 1.4–7.7)
Neutrophils Relative %: 64.5 % (ref 43.0–77.0)
Platelets: 247 K/uL (ref 150.0–400.0)
RBC: 4.66 Mil/uL (ref 3.87–5.11)
RDW: 13.6 % (ref 11.5–15.5)
WBC: 6.2 K/uL (ref 4.0–10.5)

## 2023-10-29 LAB — LIPID PANEL
Cholesterol: 245 mg/dL — ABNORMAL HIGH (ref 0–200)
HDL: 70.1 mg/dL
LDL Cholesterol: 151 mg/dL — ABNORMAL HIGH (ref 0–99)
NonHDL: 174.79
Total CHOL/HDL Ratio: 3
Triglycerides: 119 mg/dL (ref 0.0–149.0)
VLDL: 23.8 mg/dL (ref 0.0–40.0)

## 2023-10-29 LAB — COMPREHENSIVE METABOLIC PANEL WITH GFR
ALT: 15 U/L (ref 0–35)
AST: 16 U/L (ref 0–37)
Albumin: 4.7 g/dL (ref 3.5–5.2)
Alkaline Phosphatase: 95 U/L (ref 39–117)
BUN: 18 mg/dL (ref 6–23)
CO2: 31 meq/L (ref 19–32)
Calcium: 10 mg/dL (ref 8.4–10.5)
Chloride: 101 meq/L (ref 96–112)
Creatinine, Ser: 0.73 mg/dL (ref 0.40–1.20)
GFR: 95.41 mL/min (ref >60.00)
Glucose, Bld: 89 mg/dL (ref 70–99)
Potassium: 3.9 meq/L (ref 3.5–5.1)
Sodium: 138 meq/L (ref 135–145)
Total Bilirubin: 0.5 mg/dL (ref 0.2–1.2)
Total Protein: 7.9 g/dL (ref 6.0–8.3)

## 2023-10-29 LAB — HEMOGLOBIN A1C: Hgb A1c MFr Bld: 5.9 % (ref 4.6–6.5)

## 2023-10-29 MED ORDER — GABAPENTIN 300 MG PO CAPS: 300.0000 mg | ORAL_CAPSULE | Freq: Every day | ORAL | 3 refills | Status: AC

## 2023-10-29 NOTE — Progress Notes (Signed)
 Angela Lang Code 53 y.o.   Chief Complaint  Patient presents with   Annual Exam    HISTORY OF PRESENT ILLNESS: This is a 53 y.o. female here for annual exam. Also has the following complaints: 1.  Chronic cervical pain with occasional numbness and tingling to both extremities.  Gabapentin  has helped in the past.  Requesting prescription. 2.  Intermittent anal pain that wakes her up from sleep occasionally.  Denies rectal bleeding or changes in bowel movement habits. No other complaints or medical concerns today.  HPI   Prior to Admission medications   Not on File    No Known Allergies  There are no active problems to display for this patient.   No past medical history on file.  Past Surgical History:  Procedure Laterality Date   CESAREAN SECTION      Social History   Socioeconomic History   Marital status: Married    Spouse name: Not on file   Number of children: Not on file   Years of education: Not on file   Highest education level: Bachelor's degree (e.g., BA, AB, BS)  Occupational History   Not on file  Tobacco Use   Smoking status: Former    Types: Cigarettes   Smokeless tobacco: Never  Vaping Use   Vaping status: Never Used  Substance and Sexual Activity   Alcohol use: Never   Drug use: Never   Sexual activity: Yes  Other Topics Concern   Not on file  Social History Narrative   Not on file   Social Drivers of Health   Financial Resource Strain: Low Risk  (10/28/2023)   Overall Financial Resource Strain (CARDIA)    Difficulty of Paying Living Expenses: Not very hard  Food Insecurity: Food Insecurity Present (10/28/2023)   Hunger Vital Sign    Worried About Running Out of Food in the Last Year: Sometimes true    Ran Out of Food in the Last Year: Sometimes true  Transportation Needs: No Transportation Needs (10/28/2023)   PRAPARE - Administrator, Civil Service (Medical): No    Lack of Transportation (Non-Medical): No  Physical  Activity: Inactive (10/28/2023)   Exercise Vital Sign    Days of Exercise per Week: 0 days    Minutes of Exercise per Session: Not on file  Stress: No Stress Concern Present (10/28/2023)   [employer] of Occupational Health - Occupational Stress Questionnaire    Feeling of Stress: Not at all  Social Connections: Moderately Isolated (10/28/2023)   Social Connection and Isolation Panel    Frequency of Communication with Friends and Family: More than three times a week    Frequency of Social Gatherings with Friends and Family: Once a week    Attends Religious Services: Never    Database administrator or Organizations: No    Attends Engineer, structural: Not on file    Marital Status: Married  Catering manager Violence: Not on file    Family History  Problem Relation Age of Onset   Healthy Mother    Breast cancer Maternal Grandmother      Review of Systems  Constitutional: Negative.  Negative for chills and fever.  HENT: Negative.  Negative for congestion and sore throat.   Respiratory: Negative.  Negative for cough and shortness of breath.   Cardiovascular: Negative.  Negative for chest pain and palpitations.  Gastrointestinal:  Negative for abdominal pain, diarrhea, nausea and vomiting.  Genitourinary: Negative.  Negative for dysuria and  hematuria.  Musculoskeletal: Negative.   Skin: Negative.  Negative for rash.  Neurological: Negative.  Negative for dizziness and headaches.  All other systems reviewed and are negative.   Vitals:   10/29/23 1309  BP: 118/74  Pulse: 68  Temp: 98.6 F (37 C)  SpO2: 95%    Physical Exam Vitals reviewed.  Constitutional:      Appearance: Normal appearance.  HENT:     Head: Normocephalic.     Right Ear: Tympanic membrane, ear canal and external ear normal.     Left Ear: Tympanic membrane, ear canal and external ear normal.     Mouth/Throat:     Mouth: Mucous membranes are moist.     Pharynx: Oropharynx is clear.    Eyes:     Extraocular Movements: Extraocular movements intact.     Conjunctiva/sclera: Conjunctivae normal.     Pupils: Pupils are equal, round, and reactive to light.    Cardiovascular:     Rate and Rhythm: Normal rate and regular rhythm.     Pulses: Normal pulses.     Heart sounds: Normal heart sounds.  Pulmonary:     Effort: Pulmonary effort is normal.     Breath sounds: Normal breath sounds.  Abdominal:     Palpations: Abdomen is soft.     Tenderness: There is no abdominal tenderness.   Musculoskeletal:     Cervical back: No tenderness.  Lymphadenopathy:     Cervical: No cervical adenopathy.   Skin:    General: Skin is warm and dry.     Capillary Refill: Capillary refill takes less than 2 seconds.   Neurological:     General: No focal deficit present.     Mental Status: She is alert and oriented to person, place, and time.   Psychiatric:        Mood and Affect: Mood normal.        Behavior: Behavior normal.      ASSESSMENT & PLAN: Problem List Items Addressed This Visit       Other   Anal or rectal pain   Clinically stable.  Differential diagnosis discussed No red flag signs or symptoms.  Possible anal sphincter dysfunction. Pain management discussed Diet and nutrition discussed.  Advised to increase amount of daily fiber intake. 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      Relevant Medications   gabapentin  (NEURONTIN ) 300 MG capsule   Other Visit Diagnoses       Encounter for general adult medical examination with abnormal findings    -  Primary   Relevant Orders   Comprehensive metabolic panel with GFR (Completed)   CBC with Differential/Platelet (Completed)   Hemoglobin A1c (Completed)    Lipid panel (Completed)     Screening for colon cancer       Relevant Orders   Ambulatory referral to Gastroenterology     Screening for deficiency anemia       Relevant Orders   CBC with Differential/Platelet (Completed)     Screening for lipoid disorders       Relevant Orders   Lipid panel (Completed)     Screening for endocrine, metabolic and immunity disorder       Relevant Orders   Comprehensive metabolic  panel with GFR (Completed)   Hemoglobin A1c (Completed)     Screening mammogram for breast cancer       Relevant Orders   MM Digital Screening        Modifiable risk factors discussed with patient. Anticipatory guidance according to age provided. The following topics were also discussed: Social Determinants of Health Smoking.  Non-smoker Diet and nutrition Benefits of exercise Cancer screening and need for colon cancer screening with colonoscopy Vaccinations review and recommendations Cardiovascular risk assessment and need for blood work Mental health including depression and anxiety Fall and accident prevention  Patient Instructions  Mantenimiento de Radiographer, therapeutic en las mujeres Health Maintenance, Female Adoptar un estilo de vida saludable y recibir atencin preventiva son importantes para promover la salud y Counsellor. Consulte al mdico sobre: El esquema adecuado para hacerse pruebas y exmenes peridicos. Cosas que puede hacer por su cuenta para prevenir enfermedades y [place] s[place]. Qu debo saber sobre la dieta, el peso y el ejercicio? Consuma una dieta saludable  Consuma una dieta que incluya muchas verduras, frutas, productos lcteos con bajo contenido de grasa y protenas magras. No consuma muchos alimentos ricos en grasas slidas, azcares agregados o sodio. Mantenga un peso saludable El ndice de masa muscular [facility]) se cocos (keeling) islands para identificar problemas de [place]. Proporciona una estimacin de la grasa corporal basndose en el peso y la altura. Su  mdico puede ayudarle a determinar su IMC y a Personnel officer o Pharmacologist un peso saludable. Haga ejercicio con regularidad Haga ejercicio con regularidad. Esta es una de las prcticas ms importantes que puede hacer por su salud. La [employer] de los adultos deben seguir estas pautas: Education officer, environmental, al menos, 150 minutos de actividad fsica por semana. El ejercicio debe aumentar la frecuencia cardaca y Media planner transpirar (ejercicio de intensidad moderada). Hacer ejercicios de fortalecimiento por lo [employer] por semana. Agregue esto a su plan de ejercicio de intensidad moderada. Pase menos tiempo sentada. Incluso la actividad fsica ligera puede ser beneficiosa. Controle sus niveles de colesterol y lpidos en la sangre Comience a realizarse anlisis de lpidos y Oncologist en la sangre a los 20 aos y luego reptalos cada 5 aos. Hgase controlar los niveles de colesterol con mayor frecuencia si: Sus niveles de lpidos y colesterol son altos. Es mayor de 40 aos. Presenta un alto riesgo de padecer enfermedades cardacas. Qu debo saber sobre las pruebas de deteccin del cncer? Segn su historia clnica y sus antecedentes familiares, es posible que deba realizarse pruebas de deteccin del cncer en diferentes edades. Esto puede incluir pruebas de deteccin de lo siguiente: Cncer de mama. Cncer de cuello uterino. Cncer colorrectal. Cncer de piel. Cncer de pulmn. Qu debo saber sobre la enfermedad cardaca, la diabetes y la hipertensin arterial? Presin arterial y enfermedad cardaca La hipertensin arterial causa enfermedades cardacas y lesotho el riesgo de accidente cerebrovascular. Es ms probable que esto se manifieste en las personas que tienen lecturas de presin arterial alta o tienen sobrepeso. Hgase controlar la presin arterial: Cada 3 a 5 aos si tiene entre 18 y 48 aos. Todos los aos si es mayor de 40 aos. Diabetes Realcese exmenes de deteccin de la diabetes con  regularidad. Este anlisis revisa el nivel de azcar en la sangre en [place]. Hgase las pruebas de deteccin: Cada tres aos despus de los 40 aos de edad si tiene un peso normal y un bajo riesgo de padecer diabetes. Con ms frecuencia y a partir de [place] edad inferior si tiene sobrepeso o  un alto riesgo de padecer diabetes. Qu debo saber sobre la prevencin de infecciones? Hepatitis B Si tiene un riesgo ms alto de contraer hepatitis B, debe someterse a un examen de deteccin de este virus. Hable con el mdico para averiguar si tiene riesgo de contraer la infeccin por hepatitis B. Hepatitis C Se recomienda el anlisis a: [employer] 1945 y 1965. Todas las personas que tengan un riesgo de haber contrado hepatitis C. Enfermedades de transmisin sexual (ETS) Hgase las pruebas de Airline pilot de ITS, incluidas la gonorrea y la clamidia, si: Es sexualmente activa y es menor de [address]. Es mayor de [address], y Public affairs consultant informa que corre riesgo de tener este tipo de infecciones. La actividad sexual ha cambiado desde que le hicieron la ltima prueba de deteccin y tiene un riesgo mayor de tener clamidia o Copy. Pregntele al mdico si usted tiene riesgo. Pregntele al mdico si usted tiene un alto riesgo de Primary school teacher VIH. El mdico tambin puede recomendarle un medicamento recetado para ayudar a evitar la infeccin por el VIH. Si elige tomar medicamentos para prevenir el VIH, primero debe [employer] de deteccin del VIH. Luego debe hacerse anlisis cada 3 meses mientras est tomando los medicamentos. Embarazo Si est por dejar de menstruar (fase premenopusica) y usted puede quedar embarazada, busque asesoramiento antes de quedar embarazada. Tome de 400 a 800 microgramos (mcg) de cido flico todos los das si queda embarazada. Pida mtodos de control de la natalidad (anticonceptivos) si desea evitar un embarazo no deseado. Osteoporosis y rwanda La osteoporosis es  una enfermedad en la que los huesos pierden los minerales y la fuerza por el avance de la edad. El resultado pueden ser fracturas en los [place]. Si tiene 65 aos o ms, o si est en riesgo de sufrir osteoporosis y fracturas, pregunte a su mdico si debe: Hacerse pruebas de deteccin de prdida sea. Tomar un suplemento de calcio o de vitamina D para reducir el riesgo de fracturas. Recibir terapia de reemplazo hormonal (TRH) para tratar los sntomas de la menopausia. Siga estas indicaciones en su casa: Consumo de alcohol No beba alcohol si: Su mdico le indica no hacerlo. Est embarazada, puede estar embarazada o est tratando de quedar embarazada. Si bebe alcohol: Limite la cantidad que bebe a lo siguiente: De 0 a 1 bebida por da. Sepa cunta cantidad de alcohol hay en las bebidas que toma. En los [address], una medida equivale a una botella de cerveza de 12 oz (355 ml), un vaso de vino de 5 oz (148 ml) o un vaso de una bebida alcohlica de alta graduacin de 1 oz (44 ml). Estilo de vida No consuma ningn producto que contenga nicotina o tabaco. Estos productos incluyen cigarrillos, tabaco para Theatre manager y aparatos de vapeo, como los cigarrillos electrnicos. Si necesita ayuda para dejar de consumir estos productos, consulte al mdico. No consuma drogas. No comparta agujas. Solicite ayuda a su mdico si necesita apoyo o informacin para abandonar las drogas. Indicaciones generales Realcese los estudios de rutina de [address], dentales y de Wellsite geologist. Mantngase al da con las vacunas. Infrmele a su mdico si: Se siente deprimida con frecuencia. Alguna vez ha sido vctima de maltrato o no se siente seguro en su casa. Resumen Adoptar un estilo de vida saludable y recibir atencin preventiva son importantes para promover la salud y Counsellor. Siga las instrucciones del mdico acerca de una dieta saludable, el ejercicio y la realizacin de [place] o  exmenes para detectar  enfermedades. Siga las instrucciones del mdico con respecto al control del colesterol y la presin arterial. Esta informacin no tiene Theme park manager el consejo del mdico. Asegrese de hacerle al mdico cualquier pregunta que tenga. Document Revised: 09/28/2020 Document Reviewed: 09/28/2020 Elsevier Patient Education  2024 Elsevier Inc.     Emil Schaumann, MD [facility]

## 2023-10-29 NOTE — Assessment & Plan Note (Signed)
 Secondary to chronic cervical spine disease Has used gabapentin  before with success Would like to restart it Advised to take gabapentin  300 mg at bedtime New prescription sent to pharmacy of record today.

## 2023-10-29 NOTE — Assessment & Plan Note (Signed)
 Clinically stable.  Differential diagnosis discussed No red flag signs or symptoms.  Possible anal sphincter dysfunction. Pain management discussed Diet and nutrition discussed.  Advised to increase amount of daily fiber intake. Needs GI evaluation Needs colonoscopy for colon cancer screening

## 2023-10-29 NOTE — Patient Instructions (Signed)
 Mantenimiento de Radiographer, therapeutic en las mujeres Health Maintenance, Female Adoptar un estilo de vida saludable y recibir atencin preventiva son importantes para promover la salud y Counsellor. Consulte al mdico sobre: El esquema adecuado para hacerse pruebas y exmenes peridicos. Cosas que puede hacer por su cuenta para prevenir enfermedades y [place]he sano. Qu debo saber sobre la dieta, el peso y el ejercicio? Consuma una dieta saludable  Consuma una dieta que incluya muchas verduras, frutas, productos lcteos con bajo contenido de [place] y Associate Professor. No consuma muchos alimentos ricos en grasas slidas, azcares agregados o sodio. Mantenga un peso saludable El ndice de masa muscular [facility]) se [place] para identificar problemas de [place]. Proporciona una estimacin de la grasa corporal basndose en el peso y la altura. Su mdico puede ayudarle a Engineer, site IMC y a Personnel officer o Pharmacologist un peso saludable. Haga ejercicio con regularidad Haga ejercicio con regularidad. Esta es una de las prcticas ms importantes que puede hacer por su salud. La [employer] de los adultos deben seguir estas pautas: Education officer, environmental, al menos, 150 minutos de actividad fsica por semana. El ejercicio debe aumentar la frecuencia cardaca y Media planner transpirar (ejercicio de intensidad moderada). Hacer ejercicios de fortalecimiento por lo [employer] por semana. Agregue esto a su plan de ejercicio de intensidad moderada. Pase menos tiempo sentada. Incluso la actividad fsica ligera puede ser beneficiosa. Controle sus niveles de colesterol y lpidos en la sangre Comience a realizarse anlisis de lpidos y Oncologist en la sangre a los 20 aos y luego reptalos cada 5 aos. Hgase controlar los niveles de colesterol con mayor frecuencia si: Sus niveles de lpidos y colesterol son altos. Es mayor de 40 aos. Presenta un alto riesgo de padecer enfermedades cardacas. Qu debo saber sobre las pruebas de deteccin del  cncer? Segn su historia clnica y sus antecedentes familiares, es posible que deba realizarse pruebas de deteccin del cncer en diferentes edades. Esto puede incluir pruebas de deteccin de lo siguiente: Cncer de mama. Cncer de cuello uterino. Cncer colorrectal. Cncer de piel. Cncer de pulmn. Qu debo saber sobre la enfermedad cardaca, la diabetes y la hipertensin arterial? Presin arterial y enfermedad cardaca La hipertensin arterial causa enfermedades cardacas y [place] el riesgo de accidente cerebrovascular. Es ms probable que esto se manifieste en las personas que tienen lecturas de presin arterial alta o tienen sobrepeso. Hgase controlar la presin arterial: Cada 3 a 5 aos si tiene entre 18 y 50 aos. Todos los aos si es mayor de 40 aos. Diabetes Realcese exmenes de deteccin de la diabetes con regularidad. Este anlisis revisa el nivel de azcar en la sangre en [place]. Hgase las pruebas de deteccin: Cada tres aos despus de los 40 aos de edad si tiene un peso normal y un bajo riesgo de padecer diabetes. Con ms frecuencia y a partir de [place] edad inferior si tiene sobrepeso o un alto riesgo de padecer diabetes. Qu debo saber sobre la prevencin de infecciones? Hepatitis B Si tiene un riesgo ms alto de contraer hepatitis B, debe someterse a un examen de deteccin de este virus. Hable con el mdico para averiguar si tiene riesgo de contraer la infeccin por hepatitis B. Hepatitis C Se recomienda el anlisis a: [employer] 1943 y 1965. Todas las personas que tengan un riesgo de haber contrado hepatitis C. Enfermedades de transmisin sexual (ETS) Hgase las pruebas de Airline pilot de ITS, incluidas la gonorrea y la clamidia, si: Es sexualmente activa y es Adult nurse de 24  aos. Es mayor de 24 aos, y el mdico le informa que corre riesgo de tener este tipo de infecciones. La actividad sexual ha cambiado desde que le hicieron la ltima prueba de  deteccin y tiene un riesgo mayor de Warehouse manager clamidia o Copy. Pregntele al mdico si usted tiene riesgo. Pregntele al mdico si usted tiene un alto riesgo de Primary school teacher VIH. El mdico tambin puede recomendarle un medicamento recetado para ayudar a evitar la infeccin por el VIH. Si elige tomar medicamentos para prevenir el VIH, primero debe [employer] de deteccin del VIH. Luego debe hacerse anlisis cada 3 meses mientras est tomando los medicamentos. Embarazo Si est por dejar de Armed forces training and education officer (fase premenopusica) y usted puede quedar [place], busque asesoramiento antes de [place]. Tome de 400 a 800 microgramos (mcg) de cido [employer] si [place]. Pida mtodos de control de la natalidad (anticonceptivos) si desea evitar un embarazo no deseado. Osteoporosis y [place] La osteoporosis es una enfermedad en la que los huesos pierden los minerales y la fuerza por el avance de la edad. El resultado pueden ser fracturas en los [place]. Si tiene 65 aos o ms, o si est en riesgo de sufrir osteoporosis y fracturas, pregunte a su mdico si debe: Hacerse pruebas de deteccin de prdida sea. Tomar un suplemento de calcio o de vitamina D para reducir el riesgo de fracturas. Recibir terapia de reemplazo hormonal (TRH) para tratar los sntomas de la menopausia. Siga estas indicaciones en su casa: Consumo de alcohol No beba alcohol si: Su mdico le indica no hacerlo. Est embarazada, puede estar embarazada o est tratando de [place]. Si bebe alcohol: Limite la cantidad que bebe a lo siguiente: De 0 a 1 bebida por da. Sepa cunta cantidad de alcohol hay en las bebidas que toma. En los [address], una medida equivale a una botella de cerveza de 12 oz (355 ml), un vaso de vino de 5 oz (148 ml) o un vaso de una bebida alcohlica de alta graduacin de 1 oz (44 ml). Estilo de vida No consuma ningn producto que contenga nicotina o tabaco. Estos  productos incluyen cigarrillos, tabaco para Theatre manager y aparatos de vapeo, como los Administrator, Civil Service. Si necesita ayuda para dejar de consumir estos productos, consulte al mdico. No consuma drogas. No comparta agujas. Solicite ayuda a su mdico si necesita apoyo o informacin para abandonar las drogas. Indicaciones generales Realcese los estudios de rutina de [address], dentales y de Wellsite geologist. Mantngase al da con las vacunas. Infrmele a su mdico si: Se siente deprimida con frecuencia. Alguna vez ha sido vctima de [place] o no se siente seguro en su casa. Resumen Adoptar un estilo de vida saludable y recibir atencin preventiva son importantes para promover la salud y Counsellor. Siga las instrucciones del mdico acerca de una dieta saludable, el ejercicio y la realizacin de pruebas o exmenes para Hotel manager. Siga las instrucciones del mdico con respecto al control del colesterol y la presin arterial. Esta informacin no tiene Theme park manager el consejo del mdico. Asegrese de hacerle al mdico cualquier pregunta que tenga. Document Revised: 09/28/2020 Document Reviewed: 09/28/2020 Elsevier Patient Education  2024 [employer].

## 2024-01-23 ENCOUNTER — Telehealth: Payer: Self-pay | Admitting: Internal Medicine

## 2024-01-23 ENCOUNTER — Encounter: Payer: Self-pay | Admitting: Internal Medicine

## 2024-01-23 NOTE — Telephone Encounter (Signed)
 Inbound call from patients husband stating he would like to know how much the insurance covers for his wife procedure on 02/13/2024. Requesting a call back  Please advise  Thank you

## 2024-01-30 ENCOUNTER — Other Ambulatory Visit: Payer: Self-pay | Admitting: Internal Medicine

## 2024-01-30 ENCOUNTER — Ambulatory Visit (AMBULATORY_SURGERY_CENTER): Admitting: *Deleted

## 2024-01-30 VITALS — Ht 63.0 in | Wt 128.0 lb

## 2024-01-30 DIAGNOSIS — Z83719 Family history of colon polyps, unspecified: Secondary | ICD-10-CM

## 2024-01-30 DIAGNOSIS — Z1211 Encounter for screening for malignant neoplasm of colon: Secondary | ICD-10-CM

## 2024-01-30 MED ORDER — NA SULFATE-K SULFATE-MG SULF 17.5-3.13-1.6 GM/177ML PO SOLN: 1.0000 | Freq: Once | ORAL | 0 refills | Status: AC

## 2024-01-30 NOTE — Progress Notes (Signed)
 Pt's name and DOB verified at the beginning of the pre-visit with 2 identifiers  Permission given to speak with Momn present  Pt denies any difficulty with ambulating,sitting, laying down or rolling side to side  Pt has no issues moving head neck or swallowing  No egg or soy allergy known to patient   No issues known to pt with past sedation  No FH of Malignant Hyperthermia  Pt is not on home 02   Pt is not on blood thinners   Pt denies issues with constipation   Pt has frequent issues with constipation RN instructed pt to use Miralax per bottles instructions a week before prep days. Pt states they will  Pt is not on dialysis  Pt denise any abnormal heart rhythms   Pt denies any upcoming cardiac testing  Chart not reviewed by CRNA prior to [facility]  Visit in person with interpretor for Spanish  Pt states weight is 128 lb  Pt given  both [facility] main # and MD on call # prior to instructions.  Informed pt to come in at the time discussed and is shown on PV instructions.  Pt instructed to use Singlecare.com or GoodRx for a price reduction on prep  Instructed pt where to find PV instructions in My Chart  Instructed pt on all aspects of written instructions including med holds clothing to wear and foods to eat and not eat as well as after procedure legal restrictions and to call MD on call if needed.. Pt states understanding. Instructed pt to review instructions again prior to procedure and call main # given if has any questions or any issues. Pt states they will.

## 2024-02-04 ENCOUNTER — Telehealth: Payer: Self-pay | Admitting: Internal Medicine

## 2024-02-04 DIAGNOSIS — Z1211 Encounter for screening for malignant neoplasm of colon: Secondary | ICD-10-CM

## 2024-02-04 NOTE — Telephone Encounter (Signed)
 Inbound call from patient stating her pharmacy is requesting a prior authorization to medication suprep. Patient has upcoming colonoscopy on 02/13/24 Please advise  Thank you

## 2024-02-04 NOTE — Telephone Encounter (Signed)
 Per gastro office request, we do not process prior authorizations for prep kits. If this protocol process has changed, update needs to be provided to our management team.

## 2024-02-06 MED ORDER — NA SULFATE-K SULFATE-MG SULF 17.5-3.13-1.6 GM/177ML PO SOLN: 1.0000 | Freq: Once | ORAL | 0 refills | Status: AC

## 2024-02-06 NOTE — Telephone Encounter (Signed)
 Translator needed. Journalist, newspaper # [id number]. Pt states that medication needs authorization before she can pick it up. RN informed pt though interpretor that  a new RX  is to be sent to  the [facility]. Informed pt that there may be a co-payment for medication but to instructed her to also ask pharmacy if they would honor a Good RX coupon. Pt states that she has no other questions at this time.

## 2024-02-06 NOTE — Telephone Encounter (Signed)
 Inbound call from patient stating she would like to speak to nurse in regards to prep medication and pharmacy needing requesting authorization. Procedure scheduled for 02/13/24 Please advise  Thank you

## 2024-02-09 NOTE — Telephone Encounter (Signed)
 Calling pharmacy to advise them that the office does not do PA's- and to request they use the GoodRx coupon codes for this patient

## 2024-02-09 NOTE — Telephone Encounter (Signed)
 Inbound call from patient stating Rx needs to faxed to insurance first due to pharmacy charging for prep medication. Patient states she called insurance and will cover if rx sent to them  Fax number [phone] Patient requesting call from nurse  Please advise  Thank you

## 2024-02-09 NOTE — Telephone Encounter (Signed)
 I called and spoke with the pharmacist and was told that the price the patient is being asked to pay is her medication co-pay and that is the cheapest price she will get for this prep medication; will you please call and advise the patient of this information?  The prescription is also ready for pick up and will cost her around $42.00 per the pharmacist;  Thank you Bre, PV RN

## 2024-02-13 ENCOUNTER — Encounter: Payer: Self-pay | Admitting: Internal Medicine

## 2024-02-13 ENCOUNTER — Ambulatory Visit (AMBULATORY_SURGERY_CENTER): Admitting: Internal Medicine

## 2024-02-13 VITALS — BP 105/64 | HR 66 | Temp 97.9°F | Resp 12 | Ht 63.0 in | Wt 128.0 lb

## 2024-02-13 DIAGNOSIS — K648 Other hemorrhoids: Secondary | ICD-10-CM

## 2024-02-13 DIAGNOSIS — K635 Polyp of colon: Secondary | ICD-10-CM | POA: Diagnosis not present

## 2024-02-13 DIAGNOSIS — Z1211 Encounter for screening for malignant neoplasm of colon: Secondary | ICD-10-CM

## 2024-02-13 DIAGNOSIS — D123 Benign neoplasm of transverse colon: Secondary | ICD-10-CM | POA: Diagnosis not present

## 2024-02-13 DIAGNOSIS — D120 Benign neoplasm of cecum: Secondary | ICD-10-CM

## 2024-02-13 MED ORDER — SODIUM CHLORIDE 0.9 % IV SOLN: 500.0000 mL | Freq: Once | INTRAVENOUS | Status: DC

## 2024-02-13 NOTE — Progress Notes (Signed)
 GASTROENTEROLOGY PROCEDURE H&P NOTE   Primary Care Physician: Purcell Emil Schanz, MD    Reason for Procedure:  Colon cancer screening  Plan:    Colonoscopy  Patient is appropriate for endoscopic procedure(s) in the ambulatory ([facility]) setting.  The nature of the procedure, as well as the risks, benefits, and alternatives were carefully and thoroughly reviewed with the patient. Ample time for discussion and questions allowed. The patient understood, was satisfied, and agreed to proceed.     HPI: Angela Lang is a 53 y.o. female who presents for colonoscopy.  Medical history as below.  Tolerated the prep.  No recent chest pain or shortness of breath.  No abdominal pain today.  Past Medical History:  Diagnosis Date   Blood transfusion without reported diagnosis    due to Ulcer bursting   GERD (gastroesophageal reflux disease)    Hemorrhage    Stomach ulcer burst   Stomach ulcer     Past Surgical History:  Procedure Laterality Date   CESAREAN SECTION      Prior to Admission medications   Medication Sig Start Date End Date Taking? Authorizing Provider  Melatonin 10 MG CAPS Take 10 mg by mouth at bedtime.   Yes [provider]  gabapentin  (NEURONTIN ) 300 MG capsule Take 1 capsule (300 mg total) by mouth at bedtime. Patient taking differently: Take 300 mg by mouth as needed. 10/29/23   Purcell Emil Schanz, MD  ibuprofen (ADVIL) 100 MG/5ML suspension Take 200 mg by mouth as needed.    [provider]  omeprazole (PRILOSEC OTC) 20 MG tablet Take 20 mg by mouth daily.    [provider]    Current Outpatient Medications  Medication Sig Dispense Refill   Melatonin 10 MG CAPS Take 10 mg by mouth at bedtime.     gabapentin  (NEURONTIN ) 300 MG capsule Take 1 capsule (300 mg total) by mouth at bedtime. (Patient taking differently: Take 300 mg by mouth as needed.) 90 capsule 3   ibuprofen (ADVIL) 100 MG/5ML suspension Take 200 mg by mouth as  needed.     omeprazole (PRILOSEC OTC) 20 MG tablet Take 20 mg by mouth daily.     Current Facility-Administered Medications  Medication Dose Route Frequency Provider Last Rate Last Admin   0.9 %  sodium chloride infusion  500 mL Intravenous Once Aaminah Forrester, Gordy HERO, MD        Allergies as of 02/13/2024   (No Known Allergies)    Family History  Problem Relation Age of Onset   Colon polyps Mother    Esophageal cancer Father    Breast cancer Maternal Grandmother    Colon cancer Neg Hx    Rectal cancer Neg Hx    Stomach cancer Neg Hx     Social History   Socioeconomic History   Marital status: Married    Spouse name: Not on file   Number of children: Not on file   Years of education: Not on file   Highest education level: Bachelor's degree (e.g., BA, AB, BS)  Occupational History   Not on file  Tobacco Use   Smoking status: Former    Types: Cigarettes   Smokeless tobacco: Never  Vaping Use   Vaping status: Never Used  Substance and Sexual Activity   Alcohol use: Never   Drug use: Never   Sexual activity: Yes    Birth control/protection: Post-menopausal  Other Topics Concern   Not on file  Social History Narrative   Not on  file   Social Drivers of Health   Financial Resource Strain: Low Risk  (10/28/2023)   Overall Financial Resource Strain (CARDIA)    Difficulty of Paying Living Expenses: Not very hard  Food Insecurity: Food Insecurity Present (10/28/2023)   Hunger Vital Sign    Worried About Running Out of Food in the Last Year: Sometimes true    Ran Out of Food in the Last Year: Sometimes true  Transportation Needs: No Transportation Needs (10/28/2023)   PRAPARE - Administrator, Civil Service (Medical): No    Lack of Transportation (Non-Medical): No  Physical Activity: Inactive (10/28/2023)   Exercise Vital Sign    Days of Exercise per Week: 0 days    Minutes of Exercise per Session: Not on file  Stress: No Stress Concern Present (10/28/2023)   [employer] of Occupational Health - Occupational Stress Questionnaire    Feeling of Stress: Not at all  Social Connections: Moderately Isolated (10/28/2023)   Social Connection and Isolation Panel    Frequency of Communication with Friends and Family: More than three times a week    Frequency of Social Gatherings with Friends and Family: Once a week    Attends Religious Services: Never    Database administrator or Organizations: No    Attends Engineer, structural: Not on file    Marital Status: Married  Catering manager Violence: Not on file    Physical Exam: Vital signs in last 24 hours: @BP  98/70   Pulse 71   Temp 97.9 F (36.6 C)   Resp (!) 8   Ht 5' 3 (1.6 m)   Wt 128 lb (58.1 kg)   SpO2 98%   BMI 22.67 kg/m  GEN: NAD EYE: Sclerae anicteric ENT: MMM CV: Non-tachycardic Pulm: CTA b/l GI: Soft, NT/ND NEURO:  Alert & Oriented x 3   Gordy Starch, MD [facility]  02/13/2024 2:35 PM

## 2024-02-13 NOTE — Patient Instructions (Signed)
-   Se proporcion folleto sobre plipos y hemorroides. - Esperar los [employer]. - Se recomienda repetir la colonoscopia para seguimiento. Fecha por determinar cuando se disponga de los [employer]. - Continuar con la medicacin actual.   USTED TUVO UN PROCEDIMIENTO ENDOSCPICO HOY EN EL [facility]:   Lea el informe del procedimiento que se le entreg para cualquier pregunta especfica sobre lo que se Dentist.  Si el informe del examen no responde a sus preguntas, por favor llame a su gastroenterlogo para aclararlo.  Si usted solicit que no se le den [employer] de lo que se Clinical cytogeneticist en su procedimiento al [employer] va a cuidar, entonces el informe del procedimiento se ha incluido en un sobre sellado para que usted lo revise despus cuando le sea ms conveniente.   LO QUE PUEDE ESPERAR: Algunas sensaciones de hinchazn en el abdomen.  Puede tener ms gases de lo normal.  El caminar puede ayudarle a eliminar el aire que se le puso en el tracto gastrointestinal durante el procedimiento y reducir la hinchazn.  Si le hicieron una endoscopia inferior (como una colonoscopia o una sigmoidoscopia flexible), podra notar manchas de sangre en las heces fecales o en el papel higinico.  Si se someti a una preparacin intestinal para su procedimiento, es posible que no tenga una evacuacin intestinal normal durante [employer].   Tenga en cuenta:  Es posible que note un poco de irritacin y congestin en la nariz o algn drenaje.  Esto es debido al oxgeno [employer] durante su procedimiento.  No hay que preocuparse y esto debe desaparecer ms o Regulatory affairs officer.   SNTOMAS PARA REPORTAR INMEDIATAMENTE:  Despus de una endoscopia inferior (colonoscopia o sigmoidoscopia flexible):  Cantidades excesivas de sangre en las heces fecales  Sensibilidad significativa o empeoramiento de los dolores abdominales   Hinchazn aguda del abdomen que antes no  tena   Fiebre de 100F o ms    Para asuntos urgentes o de Associate Professor, puede comunicarse con un gastroenterlogo a cualquier hora llamando al [phone].  DIETA:  Recomendamos una comida pequea al principio, pero luego puede continuar con su dieta normal.  Tome muchos lquidos, pero debe evitar las bebidas alcohlicas durante 24 horas.    ACTIVIDAD:  Debe planear tomarse las cosas con calma por el resto del da y no debe CONDUCIR ni usar maquinaria pesada Patent examiner (debido a los medicamentos de sedacin utilizados durante el examen).     SEGUIMIENTO: Nuestro personal llamar al nmero que aparece en su historial al siguiente da hbil de su procedimiento para ver cmo se siente y para responder cualquier pregunta o inquietud que pueda tener con respecto a la informacin que se le dio despus del procedimiento. Si no podemos contactarle, le dejaremos un mensaje.  Sin embargo, si se siente bien y no tiene English as a second language teacher, no es necesario que nos devuelva la llamada.  Asumiremos que ha regresado a sus actividades diarias normales sin incidentes. Si se le tomaron algunas biopsias, le contactaremos por telfono o por carta en las prximas 3 semanas.  Si no ha sabido [employer] biopsias en el transcurso de 3 semanas, por favor llmenos al [phone].   FIRMAS/CONFIDENCIALIDAD: Usted y/o el acompaante que le cuide han firmado documentos que se ingresarn en su historial mdico electrnico.  Estas firmas atestiguan el hecho de que la informacin anterior

## 2024-02-13 NOTE — Progress Notes (Signed)
 Interpreter, [employer], present during admission.

## 2024-02-13 NOTE — Progress Notes (Signed)
 Pt's states no medical or surgical changes since previsit or office visit.

## 2024-02-13 NOTE — Op Note (Signed)
 [facility] Patient Name: Angela Lang Procedure Date: 02/13/2024 2:20 PM MRN: [id number] Endoscopist: Gordy CHRISTELLA Starch , MD, [id number] Age: 53 Referring MD:  Date of Birth: [birth year] Gender: Female Account #: [id number] Procedure:                Colonoscopy Indications:              Screening for colorectal malignant neoplasm, This                            is the patient's first colonoscopy Medicines:                Monitored Anesthesia Care Procedure:                Pre-Anesthesia Assessment:                           - Prior to the procedure, a History and Physical                            was performed, and patient medications and                            allergies were reviewed. The patient's tolerance of                            previous anesthesia was also reviewed. The risks                            and benefits of the procedure and the sedation                            options and risks were discussed with the patient.                            All questions were answered, and informed consent                            was obtained. Prior Anticoagulants: The patient has                            taken no anticoagulant or antiplatelet agents. ASA                            Grade Assessment: II - A patient with mild systemic                            disease. After reviewing the risks and benefits,                            the patient was deemed in satisfactory condition to                            undergo the procedure.  After obtaining informed consent, the colonoscope                            was passed under direct vision. Throughout the                            procedure, the patient's blood pressure, pulse, and                            oxygen saturations were monitored continuously. The                            Olympus Scope SN: [id number] was introduced through                            the anus and advanced to  the cecum, identified by                            appendiceal orifice and ileocecal valve. The                            colonoscopy was performed without difficulty. The                            patient tolerated the procedure well. The quality                            of the bowel preparation was excellent. The                            ileocecal valve, appendiceal orifice, and rectum                            were photographed. Scope In: 2:39:18 PM Scope Out: 2:54:55 PM Scope Withdrawal Time: 0 hours 12 minutes 43 seconds  Total Procedure Duration: 0 hours 15 minutes 37 seconds  Findings:                 The digital rectal exam was normal.                           A 2 mm polyp was found in the cecum. The polyp was                            sessile. The polyp was removed with a cold biopsy                            forceps. Resection and retrieval were complete.                           A 5 mm polyp was found in the transverse colon. The                            polyp was sessile. The  polyp was removed with a                            cold snare. Resection and retrieval were complete.                           Internal hemorrhoids were found during                            retroflexion. The hemorrhoids were small. Complications:            No immediate complications. Estimated Blood Loss:     Estimated blood loss was minimal. Impression:               - One 2 mm polyp in the cecum, removed with a cold                            biopsy forceps. Resected and retrieved.                           - One 5 mm polyp in the transverse colon, removed                            with a cold snare. Resected and retrieved.                           - Small internal hemorrhoids. Recommendation:           - Patient has a contact number available for                            emergencies. The signs and symptoms of potential                            delayed complications were  discussed with the                            patient. Return to normal activities tomorrow.                            Written discharge instructions were provided to the                            patient.                           - Resume previous diet.                           - Continue present medications.                           - Await pathology results.                           - Repeat colonoscopy is recommended. The  colonoscopy date will be determined after pathology                            results from today's exam become available for                            review. Gordy CHRISTELLA Starch, MD 02/13/2024 2:57:11 PM This report has been signed electronically.

## 2024-02-13 NOTE — Progress Notes (Signed)
 Called to room to assist during endoscopic procedure.  Patient ID and intended procedure confirmed with present staff. Received instructions for my participation in the procedure from the performing physician.

## 2024-02-13 NOTE — Progress Notes (Signed)
 To pacu, VSS. Report to [email or web address].tb

## 2024-02-16 ENCOUNTER — Telehealth: Payer: Self-pay | Admitting: Lactation Services

## 2024-02-16 NOTE — Telephone Encounter (Signed)
  Follow up Call-     02/13/2024    1:35 PM  Call back number  Post procedure Call Back phone  # [phone]  Permission to leave phone message Yes     Patient questions:  Do you have a fever, pain , or abdominal swelling? No. Pain Score  0 *  Have you tolerated food without any problems? Yes.    Have you been able to return to your normal activities? Yes.    Do you have any questions about your discharge instructions: Diet   No. Medications  No. Follow up visit  No.  Do you have questions or concerns about your Care? No.  Actions: * If pain score is 4 or above: No action needed, pain <4.

## 2024-02-18 LAB — SURGICAL PATHOLOGY

## 2024-02-19 ENCOUNTER — Ambulatory Visit: Payer: Self-pay | Admitting: Internal Medicine

## 2024-05-04 ENCOUNTER — Ambulatory Visit
Admission: EM | Admit: 2024-05-04 | Discharge: 2024-05-04 | Disposition: A | Discharge status: Home / Self Care | Attending: Family Medicine | Admitting: Family Medicine

## 2024-05-04 DIAGNOSIS — N760 Acute vaginitis: Secondary | ICD-10-CM | POA: Diagnosis not present

## 2024-05-04 DIAGNOSIS — R350 Frequency of micturition: Secondary | ICD-10-CM | POA: Diagnosis not present

## 2024-05-04 LAB — POCT URINE DIPSTICK
Bilirubin, UA: NEGATIVE
Glucose, UA: NEGATIVE mg/dL
Ketones, POC UA: NEGATIVE mg/dL
Leukocytes, UA: NEGATIVE
Nitrite, UA: NEGATIVE
POC PROTEIN,UA: NEGATIVE
Spec Grav, UA: <=1.005 — AB
Urobilinogen, UA: 0.2 U/dL
pH, UA: 5.5

## 2024-05-04 MED ORDER — FLUCONAZOLE 150 MG PO TABS: 150.0000 mg | ORAL_TABLET | ORAL | 0 refills | Status: AC

## 2024-05-04 NOTE — ED Triage Notes (Signed)
 Pt reports she started spotting last night ;low abdominal pain on and off, clear vaginal discharge x 2 week. Reports is the past 7 years she had 5 episode of rectal pain 10/10 and improved without any intervention, pt had a colonoscopy and was normal. she has some rectal pain Pt reports she had pain the last time she had intercourse. Pt reports she's being menopause x 2 years.

## 2024-05-04 NOTE — ED Provider Notes (Addendum)
 " [facility]  Note:  This document was prepared using Conservation officer, historic buildings and may include unintentional dictation errors.  MRN: [id number] DOB: [birth year]  Subjective:   Angela Lang is a 53 y.o. female presenting for 2-week history of persistent clear thick vaginal discharge, vaginal burning and itching, intermittent pelvic pains, hematuria and vaginal spotting since last night.  Would like a complete STI check except for blood work.  Patient tries to hydrate well and consistently, drinks 2 cups of coffee.  No dysuria.  Generally has urinary frequency because she tries to push fluids.  Is unchanged for her.  She does hold her urine as well when she is busy at work.  No fever, nausea, vomiting.  Current Outpatient Medications  Medication Instructions   fluconazole (DIFLUCAN) 150 mg, Oral, every 72 hours   gabapentin  (NEURONTIN ) 300 mg, Oral, Daily at bedtime   ibuprofen (ADVIL) 200 mg, As needed   Melatonin 10 mg, Daily at bedtime   omeprazole (PRILOSEC OTC) 20 mg, Daily    Allergies[1]  Past Medical History:  Diagnosis Date   Blood transfusion without reported diagnosis    due to Ulcer bursting   GERD (gastroesophageal reflux disease)    Hemorrhage    Stomach ulcer burst   Stomach ulcer      Past Surgical History:  Procedure Laterality Date   CESAREAN SECTION      Family History  Problem Relation Age of Onset   Colon polyps Mother    Esophageal cancer Father    Breast cancer Maternal Grandmother    Colon cancer Neg Hx    Rectal cancer Neg Hx    Stomach cancer Neg Hx     Social History   Occupational History   Not on file  Tobacco Use   Smoking status: Former    Types: Cigarettes   Smokeless tobacco: Never  Vaping Use   Vaping status: Never Used  Substance and Sexual Activity   Alcohol use: Not Currently   Drug use: Never   Sexual activity: Yes    Birth control/protection: Post-menopausal      ROS   Objective:   Vitals: BP (!) 152/71 (BP Location: Left Arm)   Pulse 81   Temp 98.5 F (36.9 C) (Oral)   Resp 16   LMP 01/13/2021   SpO2 95%   Physical Exam Constitutional:      General: She is not in acute distress.    Appearance: Normal appearance. She is well-developed. She is not ill-appearing, toxic-appearing or diaphoretic.  HENT:     Head: Normocephalic and atraumatic.     Nose: Nose normal.     Mouth/Throat:     Mouth: Mucous membranes are moist.     Pharynx: Oropharynx is clear.  Eyes:     General: No scleral icterus.       Right eye: No discharge.        Left eye: No discharge.     Extraocular Movements: Extraocular movements intact.     Conjunctiva/sclera: Conjunctivae normal.  Cardiovascular:     Rate and Rhythm: Normal rate.  Pulmonary:     Effort: Pulmonary effort is normal.  Abdominal:     General: Bowel sounds are normal. There is no distension.     Palpations: Abdomen is soft. There is no mass.     Tenderness: There is no abdominal tenderness. There is no right CVA tenderness, left CVA tenderness, guarding or rebound.  Skin:    General:  Skin is warm and dry.  Neurological:     General: No focal deficit present.     Mental Status: She is alert and oriented to person, place, and time.  Psychiatric:        Mood and Affect: Mood normal.        Behavior: Behavior normal.        Thought Content: Thought content normal.        Judgment: Judgment normal.     Results for orders placed or performed during the hospital encounter of 05/04/24 (from the past 24 hours)  POCT URINE DIPSTICK     Status: Abnormal   Collection Time: 05/04/24  6:00 PM  Result Value Ref Range   Color, UA yellow yellow   Clarity, UA hazy (A) clear   Glucose, UA negative negative mg/dL   Bilirubin, UA negative negative   Ketones, POC UA negative negative mg/dL   Spec Grav, UA <=8.994 (A) 1.010 - 1.025   Blood, UA large (A) negative   pH, UA 5.5 5.0 - 8.0   POC  PROTEIN,UA negative negative, trace   Urobilinogen, UA 0.2 0.2 or 1.0 E.U./dL   Nitrite, UA Negative Negative   Leukocytes, UA Negative Negative    Assessment and Plan :   PDMP not reviewed this encounter.  1. Acute vaginitis   2. Urinary frequency    Patient would like to treat empirically for yeast vaginitis.  Also possible, urinary tract infection.  Push fluids, will base treatment off of lab results including urine culture, vaginal cytology.  Counseled patient on potential for adverse effects with medications prescribed/recommended today, ER and return-to-clinic precautions discussed, patient verbalized understanding.      [1] No Known Allergies    Christopher Savannah, PA-C 05/04/24 1825  "

## 2024-05-05 ENCOUNTER — Ambulatory Visit (HOSPITAL_COMMUNITY): Payer: Self-pay

## 2024-05-05 LAB — CERVICOVAGINAL ANCILLARY ONLY
Bacterial Vaginitis (gardnerella): NEGATIVE
Candida Glabrata: NEGATIVE
Candida Vaginitis: POSITIVE — AB
Chlamydia: NEGATIVE
Comment: NEGATIVE
Comment: NEGATIVE
Comment: NEGATIVE
Comment: NEGATIVE
Comment: NEGATIVE
Comment: NORMAL
Neisseria Gonorrhea: NEGATIVE
Trichomonas: NEGATIVE

## 2024-05-06 LAB — URINE CULTURE
